# Patient Record
Sex: Female | Born: 1975 | Smoking: Never smoker
Health system: Southern US, Community
[De-identification: ages and names within clinical notes are randomized; demographics above are authoritative.]

## PROBLEM LIST (undated history)

## (undated) DIAGNOSIS — E119 Type 2 diabetes mellitus without complications: Secondary | ICD-10-CM

## (undated) HISTORY — PX: CHOLECYSTECTOMY: SHX55

## (undated) HISTORY — PX: TUBAL LIGATION: SHX77

## (undated) HISTORY — PX: CARPAL TUNNEL RELEASE: SHX101

---

## 2006-06-13 ENCOUNTER — Ambulatory Visit: Payer: Self-pay | Admitting: Obstetrics & Gynecology

## 2012-12-19 ENCOUNTER — Emergency Department (HOSPITAL_COMMUNITY): Payer: Medicare Other

## 2012-12-19 ENCOUNTER — Emergency Department (HOSPITAL_COMMUNITY)
Admission: EM | Admit: 2012-12-19 | Discharge: 2012-12-19 | Disposition: A | Discharge status: Home / Self Care | Payer: Medicare Other | Attending: Emergency Medicine | Admitting: Emergency Medicine

## 2012-12-19 ENCOUNTER — Other Ambulatory Visit: Payer: Self-pay

## 2012-12-19 ENCOUNTER — Encounter (HOSPITAL_COMMUNITY): Payer: Self-pay | Admitting: Emergency Medicine

## 2012-12-19 DIAGNOSIS — F552 Abuse of laxatives: Secondary | ICD-10-CM

## 2012-12-19 DIAGNOSIS — R404 Transient alteration of awareness: Secondary | ICD-10-CM | POA: Diagnosis not present

## 2012-12-19 DIAGNOSIS — X838XXA Intentional self-harm by other specified means, initial encounter: Secondary | ICD-10-CM

## 2012-12-19 DIAGNOSIS — E860 Dehydration: Secondary | ICD-10-CM | POA: Insufficient documentation

## 2012-12-19 DIAGNOSIS — R197 Diarrhea, unspecified: Secondary | ICD-10-CM | POA: Diagnosis not present

## 2012-12-19 DIAGNOSIS — R42 Dizziness and giddiness: Secondary | ICD-10-CM | POA: Diagnosis not present

## 2012-12-19 DIAGNOSIS — E119 Type 2 diabetes mellitus without complications: Secondary | ICD-10-CM

## 2012-12-19 DIAGNOSIS — F1910 Other psychoactive substance abuse, uncomplicated: Secondary | ICD-10-CM | POA: Insufficient documentation

## 2012-12-19 DIAGNOSIS — R55 Syncope and collapse: Secondary | ICD-10-CM

## 2012-12-19 DIAGNOSIS — Z7982 Long term (current) use of aspirin: Secondary | ICD-10-CM | POA: Insufficient documentation

## 2012-12-19 DIAGNOSIS — F6089 Other specific personality disorders: Secondary | ICD-10-CM | POA: Insufficient documentation

## 2012-12-19 DIAGNOSIS — Z3202 Encounter for pregnancy test, result negative: Secondary | ICD-10-CM | POA: Insufficient documentation

## 2012-12-19 DIAGNOSIS — Z79899 Other long term (current) drug therapy: Secondary | ICD-10-CM | POA: Insufficient documentation

## 2012-12-19 HISTORY — DX: Type 2 diabetes mellitus without complications: E11.9

## 2012-12-19 LAB — URINALYSIS, ROUTINE W REFLEX MICROSCOPIC
Glucose, UA: >1000 mg/dL — AB
Hgb urine dipstick: NEGATIVE
Specific Gravity, Urine: 1.037 — ABNORMAL HIGH (ref 1.005–1.030)
Urobilinogen, UA: 0.2 mg/dL (ref 0.0–1.0)
pH: 5.5 (ref 5.0–8.0)

## 2012-12-19 LAB — CBC
HCT: 35.7 % — ABNORMAL LOW (ref 36.0–46.0)
Hemoglobin: 12 g/dL (ref 12.0–15.0)
MCH: 27.6 pg (ref 26.0–34.0)
MCV: 82.3 fL (ref 78.0–100.0)
RBC: 4.34 MIL/uL (ref 3.87–5.11)

## 2012-12-19 LAB — BASIC METABOLIC PANEL
CO2: 22 mEq/L (ref 19–32)
Calcium: 9 mg/dL (ref 8.4–10.5)
Creatinine, Ser: 0.59 mg/dL (ref 0.50–1.10)
Glucose, Bld: 245 mg/dL — ABNORMAL HIGH (ref 70–99)

## 2012-12-19 LAB — GLUCOSE, CAPILLARY

## 2012-12-19 MED ORDER — IBUPROFEN 800 MG PO TABS
800.0000 mg | ORAL_TABLET | Freq: Once | ORAL | Status: AC
  Administered 2012-12-19: 800 mg via ORAL
  Filled 2012-12-19: qty 1

## 2012-12-19 MED ORDER — SODIUM CHLORIDE 0.9 % IV BOLUS (SEPSIS)
1000.0000 mL | Freq: Once | INTRAVENOUS | Status: AC
  Administered 2012-12-19: 1000 mL via INTRAVENOUS

## 2012-12-19 NOTE — ED Notes (Signed)
EAV:[id number]> Expected date:12/19/12<BR> Expected time: 1:18 PM<BR> Means of arrival:<BR> Comments:<BR> Near syncopal episode from [facility]

## 2012-12-19 NOTE — ED Provider Notes (Signed)
History     CSN: [id number]  Arrival date & time 12/19/12  1339   First MD Initiated Contact with Patient 12/19/12 1401      Chief Complaint  Patient presents with  . near syncopal episode     (Consider location/radiation/quality/duration/timing/severity/associated sxs/prior treatment) HPI  Erika Duarte is a 37 y.o.female presenting to the ER by EMS with complaints of near-syncopal episode. The patient is a diabetic who tells me that she has been taking laxatives in order to his weight as well as limiting her calorie intake to 200 calories a day. She has also continued to take 75 units of long acting insulin before bedtime. While at her daughter's orientation for college she became very dizzy and almost fainted. Upon arrival to the emergency department the nurses noted that she had self-inflicted cut marks on her abdomen that they "hate me". I asked the patient about it she said she did it about a week and a half ago that she did that because she's not like herself. Her glucose is 248 BP 114/88 and hr 120. Pt appears lethargic   Past Medical History  Diagnosis Date  . Diabetes mellitus without complication     Past Surgical History  Procedure Laterality Date  . Cholecystectomy    . Carpal tunnel release    . Tubal ligation      No family history on file.  History  Substance Use Topics  . Smoking status: Never Smoker   . Smokeless tobacco: Not on file  . Alcohol Use: No    OB History   Grav Para Term Preterm Abortions TAB SAB Ect Mult Living                  Review of Systems  All other systems reviewed and are negative.    Allergies  Review of patient's allergies indicates no known allergies.  Home Medications   Current Outpatient Rx  Name  Route  Sig  Dispense  Refill  . aspirin 81 MG tablet   Oral   Take 81 mg by mouth daily.         . celecoxib (CELEBREX) 100 MG capsule   Oral   Take 100 mg by mouth 2 (two) times daily.         . DULoxetine  (CYMBALTA) 60 MG capsule   Oral   Take 60 mg by mouth 2 (two) times daily.         . furosemide (LASIX) 20 MG tablet   Oral   Take 20 mg by mouth daily.         . Gabapentin, PHN, (GRALISE) 600 MG TABS   Oral   Take 3,000 mg by mouth at bedtime.         . insulin glargine (LANTUS) 100 UNIT/ML injection   Subcutaneous   Inject 70 Units into the skin at bedtime.         Marland Kitchen levothyroxine (LEVOTHROID) 200 MCG tablet   Oral   Take 200 mcg by mouth daily before breakfast. Pt takes 250 mcg daily. Pt takes with 50 mcg         . levothyroxine (SYNTHROID, LEVOTHROID) 50 MCG tablet   Oral   Take 50 mcg by mouth daily before breakfast. Pt takes with 200 mcg for a total dose of 250 mcg         . Multiple Vitamins-Minerals (MULTIVITAMIN WITH MINERALS) tablet   Oral   Take 1 tablet by mouth daily.         Marland Kitchen  nabumetone (RELAFEN) 500 MG tablet   Oral   Take 500 mg by mouth 2 (two) times daily.         . nortriptyline (PAMELOR) 25 MG capsule   Oral   Take 25 mg by mouth See admin instructions. Pt takes with 50 mg for a total dose of 75 mg         . nortriptyline (PAMELOR) 50 MG capsule   Oral   Take 50 mg by mouth See admin instructions. Pt takes with 25 mg for a total dose of 75 mg         . sennosides-docusate sodium (SENOKOT-S) 8.6-50 MG tablet   Oral   Take 2 tablets by mouth 3 (three) times daily.         . Tapentadol HCl (NUCYNTA) 100 MG TABS   Oral   Take 100 mg by mouth 2 (two) times daily.           BP 134/80  Pulse 87  Temp(Src) 98.1 F (36.7 C) (Oral)  Resp 20  SpO2 96%  LMP 12/05/2012  Physical Exam  Nursing note and vitals reviewed. Constitutional: She appears well-developed and well-nourished. She appears lethargic.  obese  HENT:  Head: Normocephalic and atraumatic.  Eyes: Pupils are equal, round, and reactive to light.  Neck: Normal range of motion. Neck supple.  Cardiovascular: Normal rate and regular rhythm.   Pulmonary/Chest:  Effort normal.  Abdominal: Soft.  Neurological: She has normal strength. She appears lethargic. She displays a negative Romberg sign.  Pt uncooperative with exam.  Skin: Skin is warm and dry.    ED Course  Procedures (including critical care time)  Labs Reviewed  GLUCOSE, CAPILLARY - Abnormal; Notable for the following:    Glucose-Capillary 240 (*)    All other components within normal limits  URINALYSIS, ROUTINE W REFLEX MICROSCOPIC - Abnormal; Notable for the following:    APPearance CLOUDY (*)    Specific Gravity, Urine 1.037 (*)    Glucose, UA >1000 (*)    All other components within normal limits  CBC - Abnormal; Notable for the following:    WBC 10.6 (*)    HCT 35.7 (*)    All other components within normal limits  BASIC METABOLIC PANEL - Abnormal; Notable for the following:    Glucose, Bld 245 (*)    All other components within normal limits  URINE MICROSCOPIC-ADD ON - Abnormal; Notable for the following:    Squamous Epithelial / LPF MANY (*)    Bacteria, UA FEW (*)    All other components within normal limits  PREGNANCY, URINE   Ct Head Wo Contrast  12/19/2012   *RADIOLOGY REPORT*  Clinical Data: 37 year old female with dizziness near syncope.  CT HEAD WITHOUT CONTRAST  Technique:  Contiguous axial images were obtained from the base of the skull through the vertex without contrast.  Comparison: [facility] CT 03/05/2006.  Findings: Visualized paranasal sinuses and mastoids are clear. Visualized orbits and scalp soft tissues are within normal limits. No acute osseous abnormality identified.  Normal cerebral volume.  No midline shift, ventriculomegaly, mass effect, evidence of mass lesion, intracranial hemorrhage or evidence of cortically based acute infarction.  Gray-white matter differentiation is within normal limits throughout the brain.  No suspicious intracranial vascular hyperdensity.  IMPRESSION: Stable and normal noncontrast CT appearance of the brain.    Original Report Authenticated By: Erskine Speed, M.D.     1. Self-harm, initial encounter   2. Dehydration  MDM   Patient most likely dehydrated but will get Head CT since she continues to act lethargic.  CT scan head is normal as well as labs over all are unremarkable. Family members are in the room with her and they endorse knowing the patient had change dher eating and that she was using laxatives. They also knew about her cutting.  Dr. Adriana Simas saw patient as well adn she is medically cleared at this time. I have asked ACT to come and speak with patient to offer any assistance she can to help the patient with her self hatred and body image problems. She has been made aware that she needs at least 1200 calories per day and can not abuse laxatives because they dehydrate her as opposed to assist weight loss.  7:26pm-  ACT gave the patient resources and talked to her about if she wants helped. Discussed with Dr. Adriana Simas and patient is medically clear to discharge at this time. Patient encouraged to eat.  36 y.o.Erika Duarte's evaluation in the Emergency Department is complete. It has been determined that no acute conditions requiring further emergency intervention are present at this time. The patient/guardian have been advised of the diagnosis and plan. We have discussed signs and symptoms that warrant return to the ED, such as changes or worsening in symptoms.  Vital signs are stable at discharge. Filed Vitals:   12/19/12 1827  BP: 134/80  Pulse: 87  Temp: 98.1 F (36.7 C)  Resp: 20    Patient/guardian has voiced understanding and agreed to follow-up with the PCP or specialist.   Dorthula Matas, PA-C 12/19/12 1927

## 2012-12-19 NOTE — ED Notes (Signed)
Per EMS, had near episode while at daughter's orientation, states she has been taking extreme amounts of laxatives and limiting calories to loose weight-also cutting self, wrote "hate me" cut across her stomach-CBG 248, BP 114/88 HR 120

## 2012-12-19 NOTE — Progress Notes (Signed)
   CARE MANAGEMENT ED NOTE 12/19/2012  Patient:  Erika Duarte, Erika Duarte.   Account Number:  [id number]  Date Initiated:  12/19/2012  Documentation initiated by:  Radford Pax  Subjective/Objective Assessment:   Patient prsented to ED with syncopal episode.     Subjective/Objective Assessment Detail:     Action/Plan:   Action/Plan Detail:   Anticipated DC Date:       Status Recommendation to Physician:   Result of Recommendation:    Other ED Services  Consult Working Plan    DC Planning Services  Other  PCP issues    Choice offered to / List presented to:            Status of service:  Completed, signed off  ED Comments:   ED Comments Detail:  Patient listed as not having PCP.  EDCM spoke to patient whho stated Dr. Nedra Hai at [facility] was her PCP.  No further needs at this time.

## 2012-12-21 NOTE — ED Provider Notes (Signed)
Medical screening examination/treatment/procedure(s) were conducted as a shared visit with non-physician practitioner(s) and myself.  I personally evaluated the patient during the encounter.  Patient is hemodynamically stable.  Will get behavioral health consultation  Donnetta Hutching, MD 12/21/12 0730

## 2013-01-22 DIAGNOSIS — E7800 Pure hypercholesterolemia, unspecified: Secondary | ICD-10-CM | POA: Diagnosis not present

## 2013-01-22 DIAGNOSIS — G894 Chronic pain syndrome: Secondary | ICD-10-CM | POA: Diagnosis not present

## 2013-01-22 DIAGNOSIS — Z5181 Encounter for therapeutic drug level monitoring: Secondary | ICD-10-CM | POA: Diagnosis not present

## 2013-01-22 DIAGNOSIS — E1142 Type 2 diabetes mellitus with diabetic polyneuropathy: Secondary | ICD-10-CM | POA: Diagnosis not present

## 2013-01-22 DIAGNOSIS — M5137 Other intervertebral disc degeneration, lumbosacral region: Secondary | ICD-10-CM | POA: Diagnosis not present

## 2013-01-22 DIAGNOSIS — Z79899 Other long term (current) drug therapy: Secondary | ICD-10-CM | POA: Diagnosis not present

## 2013-01-22 DIAGNOSIS — E119 Type 2 diabetes mellitus without complications: Secondary | ICD-10-CM | POA: Diagnosis not present

## 2013-01-22 DIAGNOSIS — E1349 Other specified diabetes mellitus with other diabetic neurological complication: Secondary | ICD-10-CM | POA: Diagnosis not present

## 2013-01-22 DIAGNOSIS — F329 Major depressive disorder, single episode, unspecified: Secondary | ICD-10-CM | POA: Diagnosis not present

## 2013-01-22 DIAGNOSIS — E039 Hypothyroidism, unspecified: Secondary | ICD-10-CM | POA: Diagnosis not present

## 2013-01-30 DIAGNOSIS — E039 Hypothyroidism, unspecified: Secondary | ICD-10-CM | POA: Diagnosis not present

## 2013-01-30 DIAGNOSIS — Z6841 Body Mass Index (BMI) 40.0 and over, adult: Secondary | ICD-10-CM | POA: Diagnosis not present

## 2013-01-30 DIAGNOSIS — E119 Type 2 diabetes mellitus without complications: Secondary | ICD-10-CM | POA: Diagnosis not present

## 2013-01-30 DIAGNOSIS — J309 Allergic rhinitis, unspecified: Secondary | ICD-10-CM | POA: Diagnosis not present

## 2013-01-30 DIAGNOSIS — E7800 Pure hypercholesterolemia, unspecified: Secondary | ICD-10-CM | POA: Diagnosis not present

## 2013-02-13 DIAGNOSIS — M5137 Other intervertebral disc degeneration, lumbosacral region: Secondary | ICD-10-CM | POA: Diagnosis not present

## 2013-02-13 DIAGNOSIS — G894 Chronic pain syndrome: Secondary | ICD-10-CM | POA: Diagnosis not present

## 2013-04-11 DIAGNOSIS — E1142 Type 2 diabetes mellitus with diabetic polyneuropathy: Secondary | ICD-10-CM | POA: Diagnosis not present

## 2013-04-11 DIAGNOSIS — E1349 Other specified diabetes mellitus with other diabetic neurological complication: Secondary | ICD-10-CM | POA: Diagnosis not present

## 2013-04-11 DIAGNOSIS — G894 Chronic pain syndrome: Secondary | ICD-10-CM | POA: Diagnosis not present

## 2013-04-11 DIAGNOSIS — M5137 Other intervertebral disc degeneration, lumbosacral region: Secondary | ICD-10-CM | POA: Diagnosis not present

## 2013-04-11 DIAGNOSIS — Z79899 Other long term (current) drug therapy: Secondary | ICD-10-CM | POA: Diagnosis not present

## 2013-06-10 DIAGNOSIS — M5137 Other intervertebral disc degeneration, lumbosacral region: Secondary | ICD-10-CM | POA: Diagnosis not present

## 2013-06-10 DIAGNOSIS — G894 Chronic pain syndrome: Secondary | ICD-10-CM | POA: Diagnosis not present

## 2013-06-10 DIAGNOSIS — E1142 Type 2 diabetes mellitus with diabetic polyneuropathy: Secondary | ICD-10-CM | POA: Diagnosis not present

## 2013-06-10 DIAGNOSIS — E1349 Other specified diabetes mellitus with other diabetic neurological complication: Secondary | ICD-10-CM | POA: Diagnosis not present

## 2013-07-08 DIAGNOSIS — E039 Hypothyroidism, unspecified: Secondary | ICD-10-CM | POA: Diagnosis not present

## 2013-07-08 DIAGNOSIS — N912 Amenorrhea, unspecified: Secondary | ICD-10-CM | POA: Diagnosis not present

## 2013-07-08 DIAGNOSIS — Z79899 Other long term (current) drug therapy: Secondary | ICD-10-CM | POA: Diagnosis not present

## 2013-07-15 DIAGNOSIS — R945 Abnormal results of liver function studies: Secondary | ICD-10-CM | POA: Diagnosis not present

## 2013-07-15 DIAGNOSIS — F329 Major depressive disorder, single episode, unspecified: Secondary | ICD-10-CM | POA: Diagnosis not present

## 2013-07-15 DIAGNOSIS — E039 Hypothyroidism, unspecified: Secondary | ICD-10-CM | POA: Diagnosis not present

## 2013-07-15 DIAGNOSIS — E119 Type 2 diabetes mellitus without complications: Secondary | ICD-10-CM | POA: Diagnosis not present

## 2013-07-30 DIAGNOSIS — E039 Hypothyroidism, unspecified: Secondary | ICD-10-CM | POA: Diagnosis not present

## 2013-07-30 DIAGNOSIS — M5137 Other intervertebral disc degeneration, lumbosacral region: Secondary | ICD-10-CM | POA: Diagnosis not present

## 2013-07-30 DIAGNOSIS — J309 Allergic rhinitis, unspecified: Secondary | ICD-10-CM | POA: Diagnosis not present

## 2013-07-30 DIAGNOSIS — E119 Type 2 diabetes mellitus without complications: Secondary | ICD-10-CM | POA: Diagnosis not present

## 2013-07-30 DIAGNOSIS — Z79899 Other long term (current) drug therapy: Secondary | ICD-10-CM | POA: Diagnosis not present

## 2013-07-30 DIAGNOSIS — E7800 Pure hypercholesterolemia, unspecified: Secondary | ICD-10-CM | POA: Diagnosis not present

## 2013-07-30 DIAGNOSIS — G894 Chronic pain syndrome: Secondary | ICD-10-CM | POA: Diagnosis not present

## 2013-07-30 DIAGNOSIS — E1349 Other specified diabetes mellitus with other diabetic neurological complication: Secondary | ICD-10-CM | POA: Diagnosis not present

## 2013-08-07 DIAGNOSIS — M545 Low back pain, unspecified: Secondary | ICD-10-CM | POA: Diagnosis not present

## 2013-09-18 DIAGNOSIS — M5137 Other intervertebral disc degeneration, lumbosacral region: Secondary | ICD-10-CM | POA: Diagnosis not present

## 2013-09-18 DIAGNOSIS — M545 Low back pain, unspecified: Secondary | ICD-10-CM | POA: Diagnosis not present

## 2013-09-18 DIAGNOSIS — E1349 Other specified diabetes mellitus with other diabetic neurological complication: Secondary | ICD-10-CM | POA: Diagnosis not present

## 2013-09-18 DIAGNOSIS — G894 Chronic pain syndrome: Secondary | ICD-10-CM | POA: Diagnosis not present

## 2013-10-16 DIAGNOSIS — E11319 Type 2 diabetes mellitus with unspecified diabetic retinopathy without macular edema: Secondary | ICD-10-CM | POA: Diagnosis not present

## 2013-10-24 DIAGNOSIS — E039 Hypothyroidism, unspecified: Secondary | ICD-10-CM | POA: Diagnosis not present

## 2013-10-24 DIAGNOSIS — IMO0001 Reserved for inherently not codable concepts without codable children: Secondary | ICD-10-CM | POA: Diagnosis not present

## 2013-10-24 DIAGNOSIS — E7800 Pure hypercholesterolemia, unspecified: Secondary | ICD-10-CM | POA: Diagnosis not present

## 2013-10-24 DIAGNOSIS — E119 Type 2 diabetes mellitus without complications: Secondary | ICD-10-CM | POA: Diagnosis not present

## 2013-10-28 DIAGNOSIS — E1142 Type 2 diabetes mellitus with diabetic polyneuropathy: Secondary | ICD-10-CM | POA: Diagnosis not present

## 2013-10-28 DIAGNOSIS — M545 Low back pain, unspecified: Secondary | ICD-10-CM | POA: Diagnosis not present

## 2013-10-28 DIAGNOSIS — M5137 Other intervertebral disc degeneration, lumbosacral region: Secondary | ICD-10-CM | POA: Diagnosis not present

## 2013-10-28 DIAGNOSIS — E1349 Other specified diabetes mellitus with other diabetic neurological complication: Secondary | ICD-10-CM | POA: Diagnosis not present

## 2013-10-28 DIAGNOSIS — G894 Chronic pain syndrome: Secondary | ICD-10-CM | POA: Diagnosis not present

## 2014-01-01 DIAGNOSIS — G894 Chronic pain syndrome: Secondary | ICD-10-CM | POA: Diagnosis not present

## 2014-01-27 DIAGNOSIS — F411 Generalized anxiety disorder: Secondary | ICD-10-CM | POA: Diagnosis not present

## 2014-03-03 DIAGNOSIS — Z5181 Encounter for therapeutic drug level monitoring: Secondary | ICD-10-CM | POA: Diagnosis not present

## 2014-03-03 DIAGNOSIS — M545 Low back pain, unspecified: Secondary | ICD-10-CM | POA: Diagnosis not present

## 2014-03-03 DIAGNOSIS — M5137 Other intervertebral disc degeneration, lumbosacral region: Secondary | ICD-10-CM | POA: Diagnosis not present

## 2014-03-03 DIAGNOSIS — G894 Chronic pain syndrome: Secondary | ICD-10-CM | POA: Diagnosis not present

## 2014-03-03 DIAGNOSIS — E1142 Type 2 diabetes mellitus with diabetic polyneuropathy: Secondary | ICD-10-CM | POA: Diagnosis not present

## 2014-03-03 DIAGNOSIS — E1349 Other specified diabetes mellitus with other diabetic neurological complication: Secondary | ICD-10-CM | POA: Diagnosis not present

## 2014-03-03 DIAGNOSIS — Z79899 Other long term (current) drug therapy: Secondary | ICD-10-CM | POA: Diagnosis not present

## 2014-03-04 DIAGNOSIS — F329 Major depressive disorder, single episode, unspecified: Secondary | ICD-10-CM | POA: Diagnosis not present

## 2014-03-04 DIAGNOSIS — F3289 Other specified depressive episodes: Secondary | ICD-10-CM | POA: Diagnosis not present

## 2014-03-04 DIAGNOSIS — E119 Type 2 diabetes mellitus without complications: Secondary | ICD-10-CM | POA: Diagnosis not present

## 2014-03-04 DIAGNOSIS — R945 Abnormal results of liver function studies: Secondary | ICD-10-CM | POA: Diagnosis not present

## 2014-03-04 DIAGNOSIS — E039 Hypothyroidism, unspecified: Secondary | ICD-10-CM | POA: Diagnosis not present

## 2014-03-04 DIAGNOSIS — E7800 Pure hypercholesterolemia, unspecified: Secondary | ICD-10-CM | POA: Diagnosis not present

## 2014-03-18 DIAGNOSIS — E119 Type 2 diabetes mellitus without complications: Secondary | ICD-10-CM | POA: Diagnosis not present

## 2014-03-18 DIAGNOSIS — E039 Hypothyroidism, unspecified: Secondary | ICD-10-CM | POA: Diagnosis not present

## 2014-03-18 DIAGNOSIS — IMO0001 Reserved for inherently not codable concepts without codable children: Secondary | ICD-10-CM | POA: Diagnosis not present

## 2014-03-18 DIAGNOSIS — E7800 Pure hypercholesterolemia, unspecified: Secondary | ICD-10-CM | POA: Diagnosis not present

## 2014-03-24 DIAGNOSIS — M5137 Other intervertebral disc degeneration, lumbosacral region: Secondary | ICD-10-CM | POA: Diagnosis not present

## 2014-03-24 DIAGNOSIS — E1349 Other specified diabetes mellitus with other diabetic neurological complication: Secondary | ICD-10-CM | POA: Diagnosis not present

## 2014-03-24 DIAGNOSIS — M545 Low back pain, unspecified: Secondary | ICD-10-CM | POA: Diagnosis not present

## 2014-03-24 DIAGNOSIS — G894 Chronic pain syndrome: Secondary | ICD-10-CM | POA: Diagnosis not present

## 2014-04-06 DIAGNOSIS — G894 Chronic pain syndrome: Secondary | ICD-10-CM | POA: Diagnosis not present

## 2014-04-06 DIAGNOSIS — G8929 Other chronic pain: Secondary | ICD-10-CM | POA: Diagnosis not present

## 2014-04-06 DIAGNOSIS — M5137 Other intervertebral disc degeneration, lumbosacral region: Secondary | ICD-10-CM | POA: Diagnosis not present

## 2014-04-06 DIAGNOSIS — M549 Dorsalgia, unspecified: Secondary | ICD-10-CM | POA: Diagnosis not present

## 2014-04-13 DIAGNOSIS — Z23 Encounter for immunization: Secondary | ICD-10-CM | POA: Diagnosis not present

## 2014-04-13 DIAGNOSIS — E039 Hypothyroidism, unspecified: Secondary | ICD-10-CM | POA: Diagnosis not present

## 2014-04-13 DIAGNOSIS — E1149 Type 2 diabetes mellitus with other diabetic neurological complication: Secondary | ICD-10-CM | POA: Diagnosis not present

## 2014-04-13 DIAGNOSIS — IMO0001 Reserved for inherently not codable concepts without codable children: Secondary | ICD-10-CM | POA: Diagnosis not present

## 2014-05-06 DIAGNOSIS — Z794 Long term (current) use of insulin: Secondary | ICD-10-CM | POA: Diagnosis not present

## 2014-05-06 DIAGNOSIS — F17210 Nicotine dependence, cigarettes, uncomplicated: Secondary | ICD-10-CM | POA: Diagnosis not present

## 2014-05-06 DIAGNOSIS — M5416 Radiculopathy, lumbar region: Secondary | ICD-10-CM | POA: Diagnosis not present

## 2014-05-06 DIAGNOSIS — M1990 Unspecified osteoarthritis, unspecified site: Secondary | ICD-10-CM | POA: Diagnosis not present

## 2014-05-06 DIAGNOSIS — G894 Chronic pain syndrome: Secondary | ICD-10-CM | POA: Diagnosis not present

## 2014-05-06 DIAGNOSIS — E119 Type 2 diabetes mellitus without complications: Secondary | ICD-10-CM | POA: Diagnosis not present

## 2014-05-06 DIAGNOSIS — I10 Essential (primary) hypertension: Secondary | ICD-10-CM | POA: Diagnosis not present

## 2014-05-06 DIAGNOSIS — Z6841 Body Mass Index (BMI) 40.0 and over, adult: Secondary | ICD-10-CM | POA: Diagnosis not present

## 2014-05-19 DIAGNOSIS — G609 Hereditary and idiopathic neuropathy, unspecified: Secondary | ICD-10-CM | POA: Diagnosis not present

## 2014-05-19 DIAGNOSIS — M549 Dorsalgia, unspecified: Secondary | ICD-10-CM | POA: Diagnosis not present

## 2014-05-19 DIAGNOSIS — G894 Chronic pain syndrome: Secondary | ICD-10-CM | POA: Diagnosis not present

## 2014-05-19 DIAGNOSIS — Z79891 Long term (current) use of opiate analgesic: Secondary | ICD-10-CM | POA: Diagnosis not present

## 2014-06-18 DIAGNOSIS — G609 Hereditary and idiopathic neuropathy, unspecified: Secondary | ICD-10-CM | POA: Diagnosis not present

## 2014-06-18 DIAGNOSIS — Z9689 Presence of other specified functional implants: Secondary | ICD-10-CM | POA: Diagnosis not present

## 2014-06-18 DIAGNOSIS — E0840 Diabetes mellitus due to underlying condition with diabetic neuropathy, unspecified: Secondary | ICD-10-CM | POA: Diagnosis not present

## 2014-06-18 DIAGNOSIS — M549 Dorsalgia, unspecified: Secondary | ICD-10-CM | POA: Diagnosis not present

## 2014-06-18 DIAGNOSIS — G894 Chronic pain syndrome: Secondary | ICD-10-CM | POA: Diagnosis not present

## 2014-07-30 DIAGNOSIS — R945 Abnormal results of liver function studies: Secondary | ICD-10-CM | POA: Diagnosis not present

## 2014-07-30 DIAGNOSIS — Z6841 Body Mass Index (BMI) 40.0 and over, adult: Secondary | ICD-10-CM | POA: Diagnosis not present

## 2014-07-30 DIAGNOSIS — F329 Major depressive disorder, single episode, unspecified: Secondary | ICD-10-CM | POA: Diagnosis not present

## 2014-07-30 DIAGNOSIS — E1142 Type 2 diabetes mellitus with diabetic polyneuropathy: Secondary | ICD-10-CM | POA: Diagnosis not present

## 2014-07-30 DIAGNOSIS — E1165 Type 2 diabetes mellitus with hyperglycemia: Secondary | ICD-10-CM | POA: Diagnosis not present

## 2014-07-30 DIAGNOSIS — J309 Allergic rhinitis, unspecified: Secondary | ICD-10-CM | POA: Diagnosis not present

## 2014-07-30 DIAGNOSIS — M159 Polyosteoarthritis, unspecified: Secondary | ICD-10-CM | POA: Diagnosis not present

## 2014-07-30 DIAGNOSIS — E039 Hypothyroidism, unspecified: Secondary | ICD-10-CM | POA: Diagnosis not present

## 2014-07-30 DIAGNOSIS — M797 Fibromyalgia: Secondary | ICD-10-CM | POA: Diagnosis not present

## 2014-07-30 DIAGNOSIS — E780 Pure hypercholesterolemia: Secondary | ICD-10-CM | POA: Diagnosis not present

## 2014-07-30 DIAGNOSIS — R600 Localized edema: Secondary | ICD-10-CM | POA: Diagnosis not present

## 2014-08-05 DIAGNOSIS — J309 Allergic rhinitis, unspecified: Secondary | ICD-10-CM | POA: Diagnosis not present

## 2014-08-05 DIAGNOSIS — M159 Polyosteoarthritis, unspecified: Secondary | ICD-10-CM | POA: Diagnosis not present

## 2014-08-05 DIAGNOSIS — E039 Hypothyroidism, unspecified: Secondary | ICD-10-CM | POA: Diagnosis not present

## 2014-08-05 DIAGNOSIS — E780 Pure hypercholesterolemia: Secondary | ICD-10-CM | POA: Diagnosis not present

## 2014-08-05 DIAGNOSIS — F329 Major depressive disorder, single episode, unspecified: Secondary | ICD-10-CM | POA: Diagnosis not present

## 2014-08-05 DIAGNOSIS — Z6841 Body Mass Index (BMI) 40.0 and over, adult: Secondary | ICD-10-CM | POA: Diagnosis not present

## 2014-08-05 DIAGNOSIS — M797 Fibromyalgia: Secondary | ICD-10-CM | POA: Diagnosis not present

## 2014-08-05 DIAGNOSIS — E1142 Type 2 diabetes mellitus with diabetic polyneuropathy: Secondary | ICD-10-CM | POA: Diagnosis not present

## 2014-08-05 DIAGNOSIS — E1165 Type 2 diabetes mellitus with hyperglycemia: Secondary | ICD-10-CM | POA: Diagnosis not present

## 2014-08-05 DIAGNOSIS — R945 Abnormal results of liver function studies: Secondary | ICD-10-CM | POA: Diagnosis not present

## 2014-08-05 DIAGNOSIS — R600 Localized edema: Secondary | ICD-10-CM | POA: Diagnosis not present

## 2014-08-12 DIAGNOSIS — E780 Pure hypercholesterolemia: Secondary | ICD-10-CM | POA: Diagnosis not present

## 2014-08-12 DIAGNOSIS — J309 Allergic rhinitis, unspecified: Secondary | ICD-10-CM | POA: Diagnosis not present

## 2014-08-12 DIAGNOSIS — E039 Hypothyroidism, unspecified: Secondary | ICD-10-CM | POA: Diagnosis not present

## 2014-08-12 DIAGNOSIS — R600 Localized edema: Secondary | ICD-10-CM | POA: Diagnosis not present

## 2014-08-12 DIAGNOSIS — E1165 Type 2 diabetes mellitus with hyperglycemia: Secondary | ICD-10-CM | POA: Diagnosis not present

## 2014-08-12 DIAGNOSIS — E1142 Type 2 diabetes mellitus with diabetic polyneuropathy: Secondary | ICD-10-CM | POA: Diagnosis not present

## 2014-08-12 DIAGNOSIS — R945 Abnormal results of liver function studies: Secondary | ICD-10-CM | POA: Diagnosis not present

## 2014-08-12 DIAGNOSIS — M159 Polyosteoarthritis, unspecified: Secondary | ICD-10-CM | POA: Diagnosis not present

## 2014-08-12 DIAGNOSIS — F329 Major depressive disorder, single episode, unspecified: Secondary | ICD-10-CM | POA: Diagnosis not present

## 2014-08-12 DIAGNOSIS — M797 Fibromyalgia: Secondary | ICD-10-CM | POA: Diagnosis not present

## 2014-08-12 DIAGNOSIS — Z6841 Body Mass Index (BMI) 40.0 and over, adult: Secondary | ICD-10-CM | POA: Diagnosis not present

## 2014-08-20 DIAGNOSIS — M549 Dorsalgia, unspecified: Secondary | ICD-10-CM | POA: Diagnosis not present

## 2014-08-20 DIAGNOSIS — G609 Hereditary and idiopathic neuropathy, unspecified: Secondary | ICD-10-CM | POA: Diagnosis not present

## 2014-08-20 DIAGNOSIS — G894 Chronic pain syndrome: Secondary | ICD-10-CM | POA: Diagnosis not present

## 2014-08-20 DIAGNOSIS — Z5181 Encounter for therapeutic drug level monitoring: Secondary | ICD-10-CM | POA: Diagnosis not present

## 2014-08-20 DIAGNOSIS — Z9689 Presence of other specified functional implants: Secondary | ICD-10-CM | POA: Diagnosis not present

## 2014-08-20 DIAGNOSIS — Z79899 Other long term (current) drug therapy: Secondary | ICD-10-CM | POA: Diagnosis not present

## 2014-09-03 DIAGNOSIS — F329 Major depressive disorder, single episode, unspecified: Secondary | ICD-10-CM | POA: Diagnosis not present

## 2014-09-03 DIAGNOSIS — E039 Hypothyroidism, unspecified: Secondary | ICD-10-CM | POA: Diagnosis not present

## 2014-09-03 DIAGNOSIS — R600 Localized edema: Secondary | ICD-10-CM | POA: Diagnosis not present

## 2014-09-03 DIAGNOSIS — E1165 Type 2 diabetes mellitus with hyperglycemia: Secondary | ICD-10-CM | POA: Diagnosis not present

## 2014-09-03 DIAGNOSIS — M159 Polyosteoarthritis, unspecified: Secondary | ICD-10-CM | POA: Diagnosis not present

## 2014-09-03 DIAGNOSIS — M797 Fibromyalgia: Secondary | ICD-10-CM | POA: Diagnosis not present

## 2014-09-03 DIAGNOSIS — M65841 Other synovitis and tenosynovitis, right hand: Secondary | ICD-10-CM | POA: Diagnosis not present

## 2014-09-03 DIAGNOSIS — R945 Abnormal results of liver function studies: Secondary | ICD-10-CM | POA: Diagnosis not present

## 2014-09-03 DIAGNOSIS — J309 Allergic rhinitis, unspecified: Secondary | ICD-10-CM | POA: Diagnosis not present

## 2014-09-03 DIAGNOSIS — E780 Pure hypercholesterolemia: Secondary | ICD-10-CM | POA: Diagnosis not present

## 2014-09-03 DIAGNOSIS — E1142 Type 2 diabetes mellitus with diabetic polyneuropathy: Secondary | ICD-10-CM | POA: Diagnosis not present

## 2014-09-11 DIAGNOSIS — R945 Abnormal results of liver function studies: Secondary | ICD-10-CM | POA: Diagnosis not present

## 2014-09-11 DIAGNOSIS — M159 Polyosteoarthritis, unspecified: Secondary | ICD-10-CM | POA: Diagnosis not present

## 2014-09-11 DIAGNOSIS — E1165 Type 2 diabetes mellitus with hyperglycemia: Secondary | ICD-10-CM | POA: Diagnosis not present

## 2014-09-11 DIAGNOSIS — Z6841 Body Mass Index (BMI) 40.0 and over, adult: Secondary | ICD-10-CM | POA: Diagnosis not present

## 2014-09-11 DIAGNOSIS — R600 Localized edema: Secondary | ICD-10-CM | POA: Diagnosis not present

## 2014-09-11 DIAGNOSIS — E1142 Type 2 diabetes mellitus with diabetic polyneuropathy: Secondary | ICD-10-CM | POA: Diagnosis not present

## 2014-09-11 DIAGNOSIS — J309 Allergic rhinitis, unspecified: Secondary | ICD-10-CM | POA: Diagnosis not present

## 2014-09-11 DIAGNOSIS — M797 Fibromyalgia: Secondary | ICD-10-CM | POA: Diagnosis not present

## 2014-09-11 DIAGNOSIS — E039 Hypothyroidism, unspecified: Secondary | ICD-10-CM | POA: Diagnosis not present

## 2014-09-11 DIAGNOSIS — F329 Major depressive disorder, single episode, unspecified: Secondary | ICD-10-CM | POA: Diagnosis not present

## 2014-09-11 DIAGNOSIS — E780 Pure hypercholesterolemia: Secondary | ICD-10-CM | POA: Diagnosis not present

## 2014-09-17 DIAGNOSIS — G609 Hereditary and idiopathic neuropathy, unspecified: Secondary | ICD-10-CM | POA: Diagnosis not present

## 2014-09-17 DIAGNOSIS — G894 Chronic pain syndrome: Secondary | ICD-10-CM | POA: Diagnosis not present

## 2014-09-17 DIAGNOSIS — Z9689 Presence of other specified functional implants: Secondary | ICD-10-CM | POA: Diagnosis not present

## 2014-09-17 DIAGNOSIS — M549 Dorsalgia, unspecified: Secondary | ICD-10-CM | POA: Diagnosis not present

## 2014-10-12 DIAGNOSIS — M159 Polyosteoarthritis, unspecified: Secondary | ICD-10-CM | POA: Diagnosis not present

## 2014-10-12 DIAGNOSIS — E780 Pure hypercholesterolemia: Secondary | ICD-10-CM | POA: Diagnosis not present

## 2014-10-12 DIAGNOSIS — E039 Hypothyroidism, unspecified: Secondary | ICD-10-CM | POA: Diagnosis not present

## 2014-10-12 DIAGNOSIS — J309 Allergic rhinitis, unspecified: Secondary | ICD-10-CM | POA: Diagnosis not present

## 2014-10-12 DIAGNOSIS — E1165 Type 2 diabetes mellitus with hyperglycemia: Secondary | ICD-10-CM | POA: Diagnosis not present

## 2014-10-12 DIAGNOSIS — R600 Localized edema: Secondary | ICD-10-CM | POA: Diagnosis not present

## 2014-10-12 DIAGNOSIS — F329 Major depressive disorder, single episode, unspecified: Secondary | ICD-10-CM | POA: Diagnosis not present

## 2014-10-12 DIAGNOSIS — M797 Fibromyalgia: Secondary | ICD-10-CM | POA: Diagnosis not present

## 2014-10-12 DIAGNOSIS — E1142 Type 2 diabetes mellitus with diabetic polyneuropathy: Secondary | ICD-10-CM | POA: Diagnosis not present

## 2014-10-12 DIAGNOSIS — R945 Abnormal results of liver function studies: Secondary | ICD-10-CM | POA: Diagnosis not present

## 2014-10-12 DIAGNOSIS — Z6841 Body Mass Index (BMI) 40.0 and over, adult: Secondary | ICD-10-CM | POA: Diagnosis not present

## 2014-10-16 DIAGNOSIS — G894 Chronic pain syndrome: Secondary | ICD-10-CM | POA: Diagnosis not present

## 2014-10-16 DIAGNOSIS — E0840 Diabetes mellitus due to underlying condition with diabetic neuropathy, unspecified: Secondary | ICD-10-CM | POA: Diagnosis not present

## 2014-10-16 DIAGNOSIS — M5136 Other intervertebral disc degeneration, lumbar region: Secondary | ICD-10-CM | POA: Diagnosis not present

## 2014-10-30 DIAGNOSIS — F329 Major depressive disorder, single episode, unspecified: Secondary | ICD-10-CM | POA: Diagnosis not present

## 2014-10-30 DIAGNOSIS — J309 Allergic rhinitis, unspecified: Secondary | ICD-10-CM | POA: Diagnosis not present

## 2014-10-30 DIAGNOSIS — R945 Abnormal results of liver function studies: Secondary | ICD-10-CM | POA: Diagnosis not present

## 2014-10-30 DIAGNOSIS — E039 Hypothyroidism, unspecified: Secondary | ICD-10-CM | POA: Diagnosis not present

## 2014-10-30 DIAGNOSIS — M797 Fibromyalgia: Secondary | ICD-10-CM | POA: Diagnosis not present

## 2014-10-30 DIAGNOSIS — M159 Polyosteoarthritis, unspecified: Secondary | ICD-10-CM | POA: Diagnosis not present

## 2014-10-30 DIAGNOSIS — E1142 Type 2 diabetes mellitus with diabetic polyneuropathy: Secondary | ICD-10-CM | POA: Diagnosis not present

## 2014-10-30 DIAGNOSIS — E1165 Type 2 diabetes mellitus with hyperglycemia: Secondary | ICD-10-CM | POA: Diagnosis not present

## 2014-10-30 DIAGNOSIS — Z6841 Body Mass Index (BMI) 40.0 and over, adult: Secondary | ICD-10-CM | POA: Diagnosis not present

## 2014-10-30 DIAGNOSIS — E780 Pure hypercholesterolemia: Secondary | ICD-10-CM | POA: Diagnosis not present

## 2014-10-30 DIAGNOSIS — R600 Localized edema: Secondary | ICD-10-CM | POA: Diagnosis not present

## 2014-11-10 DIAGNOSIS — E039 Hypothyroidism, unspecified: Secondary | ICD-10-CM | POA: Diagnosis not present

## 2014-11-10 DIAGNOSIS — E1165 Type 2 diabetes mellitus with hyperglycemia: Secondary | ICD-10-CM | POA: Diagnosis not present

## 2014-11-10 DIAGNOSIS — F329 Major depressive disorder, single episode, unspecified: Secondary | ICD-10-CM | POA: Diagnosis not present

## 2014-11-10 DIAGNOSIS — E780 Pure hypercholesterolemia: Secondary | ICD-10-CM | POA: Diagnosis not present

## 2014-11-10 DIAGNOSIS — M797 Fibromyalgia: Secondary | ICD-10-CM | POA: Diagnosis not present

## 2014-11-10 DIAGNOSIS — R51 Headache: Secondary | ICD-10-CM | POA: Diagnosis not present

## 2014-11-10 DIAGNOSIS — J309 Allergic rhinitis, unspecified: Secondary | ICD-10-CM | POA: Diagnosis not present

## 2014-11-10 DIAGNOSIS — Z1389 Encounter for screening for other disorder: Secondary | ICD-10-CM | POA: Diagnosis not present

## 2014-11-10 DIAGNOSIS — Z9181 History of falling: Secondary | ICD-10-CM | POA: Diagnosis not present

## 2014-11-10 DIAGNOSIS — R945 Abnormal results of liver function studies: Secondary | ICD-10-CM | POA: Diagnosis not present

## 2014-11-10 DIAGNOSIS — R600 Localized edema: Secondary | ICD-10-CM | POA: Diagnosis not present

## 2014-11-10 DIAGNOSIS — M159 Polyosteoarthritis, unspecified: Secondary | ICD-10-CM | POA: Diagnosis not present

## 2014-11-10 DIAGNOSIS — E1142 Type 2 diabetes mellitus with diabetic polyneuropathy: Secondary | ICD-10-CM | POA: Diagnosis not present

## 2014-11-11 DIAGNOSIS — H5213 Myopia, bilateral: Secondary | ICD-10-CM | POA: Diagnosis not present

## 2014-11-11 DIAGNOSIS — E119 Type 2 diabetes mellitus without complications: Secondary | ICD-10-CM | POA: Diagnosis not present

## 2014-11-13 DIAGNOSIS — E0840 Diabetes mellitus due to underlying condition with diabetic neuropathy, unspecified: Secondary | ICD-10-CM | POA: Diagnosis not present

## 2014-11-13 DIAGNOSIS — M5136 Other intervertebral disc degeneration, lumbar region: Secondary | ICD-10-CM | POA: Diagnosis not present

## 2014-11-13 DIAGNOSIS — G894 Chronic pain syndrome: Secondary | ICD-10-CM | POA: Diagnosis not present

## 2014-11-13 DIAGNOSIS — G609 Hereditary and idiopathic neuropathy, unspecified: Secondary | ICD-10-CM | POA: Diagnosis not present

## 2014-12-01 IMAGING — CT CT HEAD W/O CM
2 series · 16 of 30 positions shown, 20 images · non-contrast
Comparison: [facility] CT 03/05/2006.

CLINICAL DATA: 36-year-old female with dizziness near syncope.

CT HEAD WITHOUT CONTRAST
TECHNIQUE: Contiguous axial images were obtained from the base of
the skull through the vertex without contrast.

[Series 2: head w/o · axial · non-contrast · 0.43mm/px · z∈[-36,+89]mm · 13 of 31 slices shown, 17 images]
[im 3/31  brain]
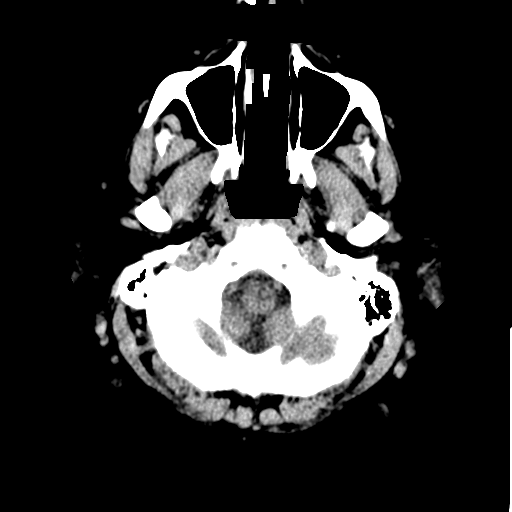
[im 3/31  bone]
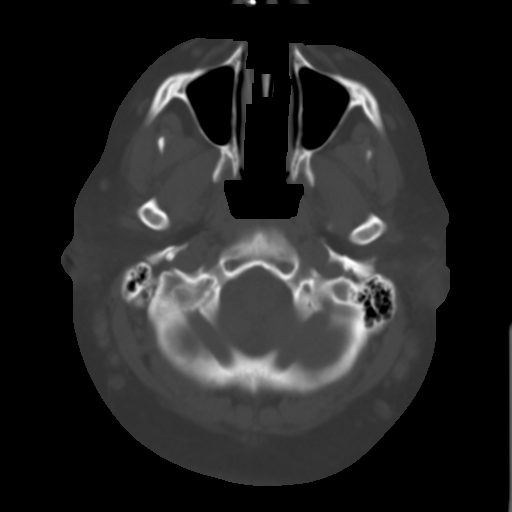
[im 5/31  brain]
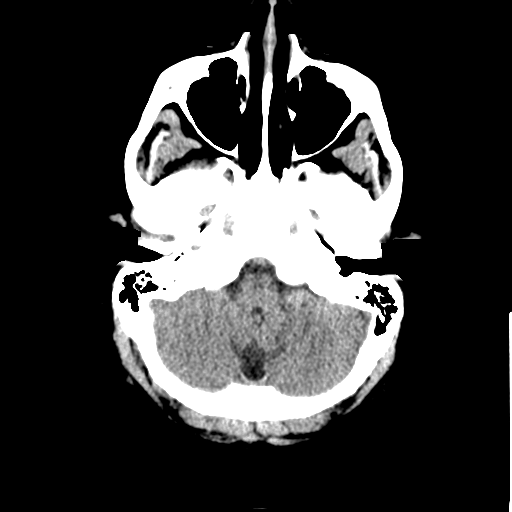
[im 7/31  brain]
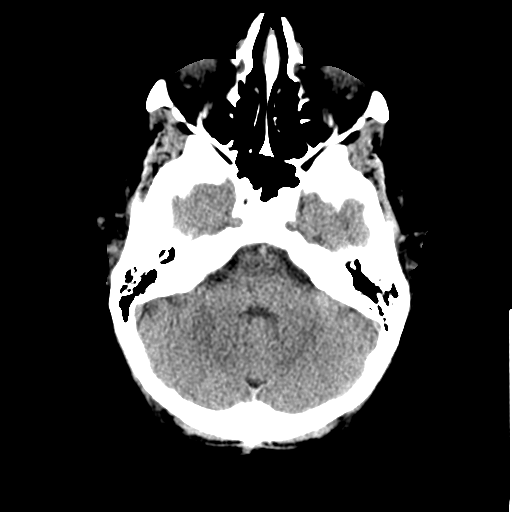
[im 9/31  brain]
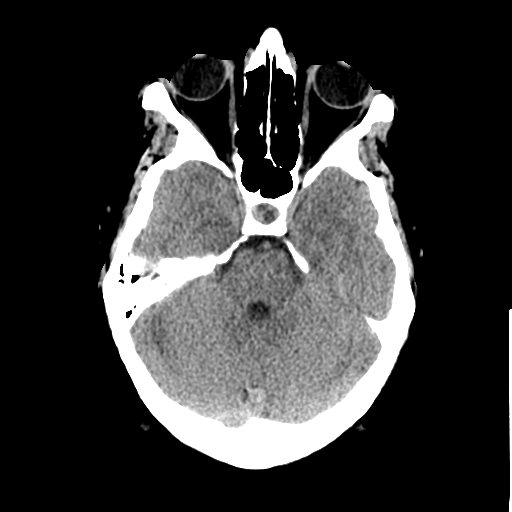
[im 11/31  brain]
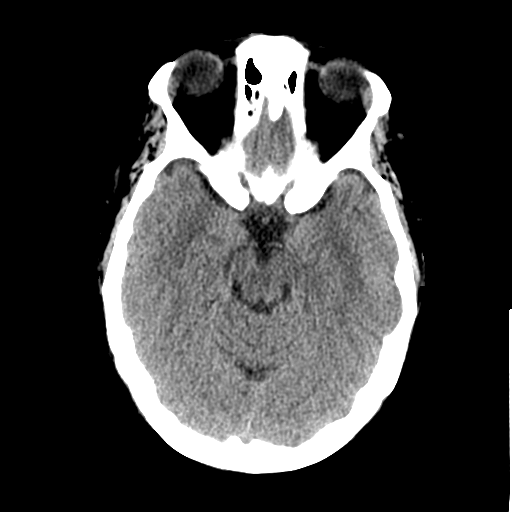
[im 11/31  bone]
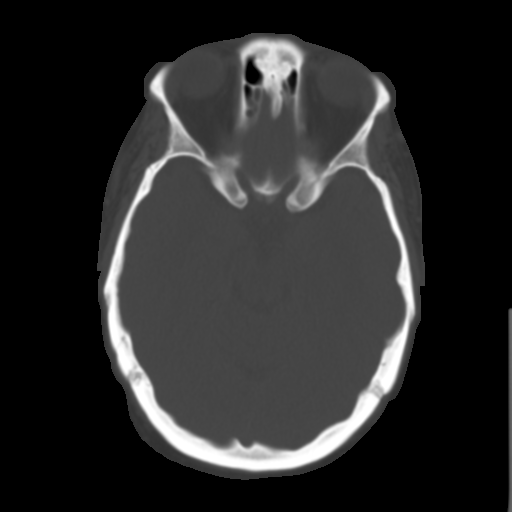
[im 13/31  brain]
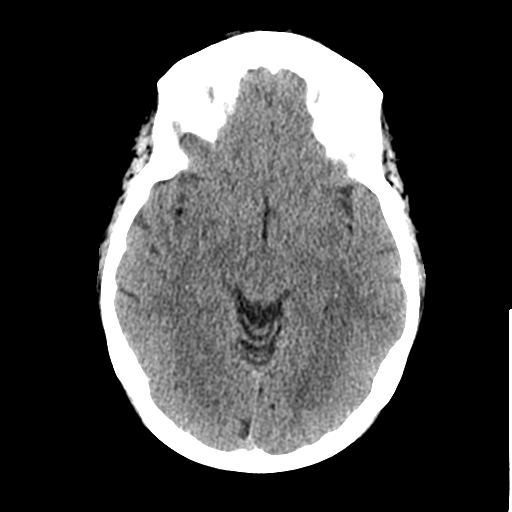
[im 16/31  brain]
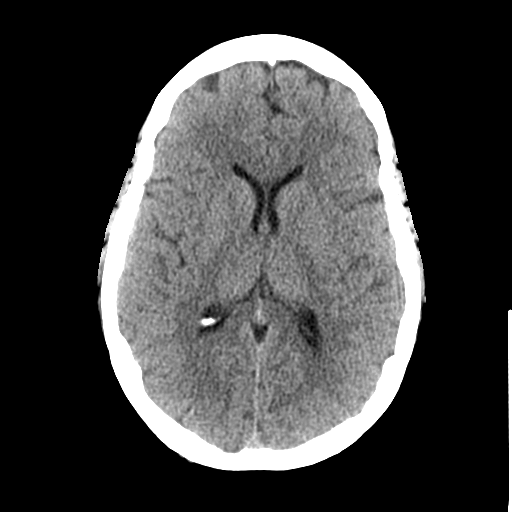
[im 18/31  brain]
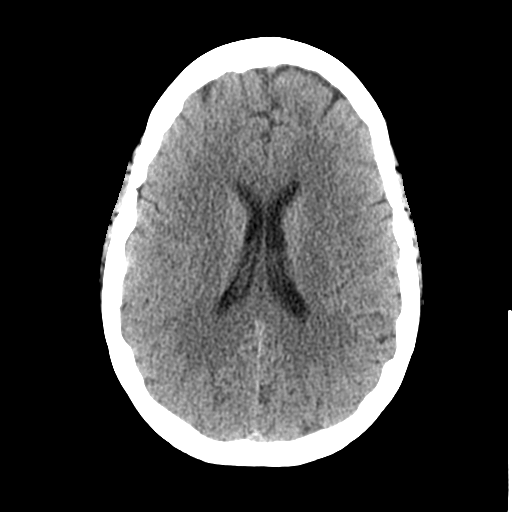
[im 20/31  brain]
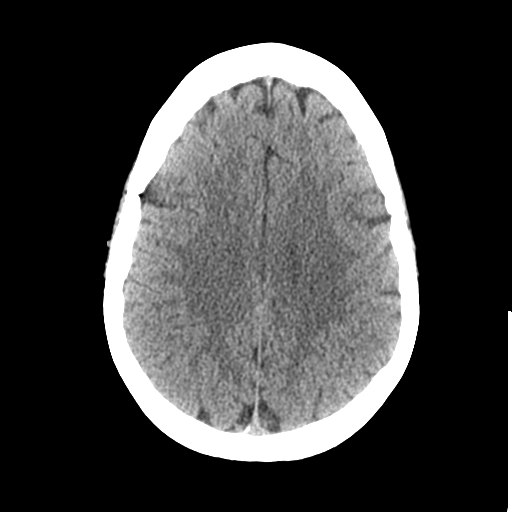
[im 20/31  bone]
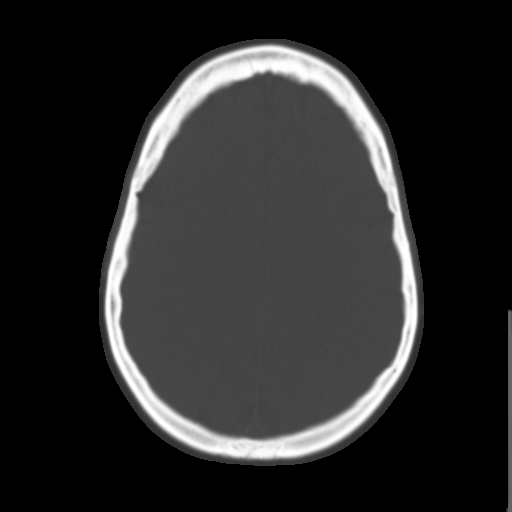
[im 22/31  brain]
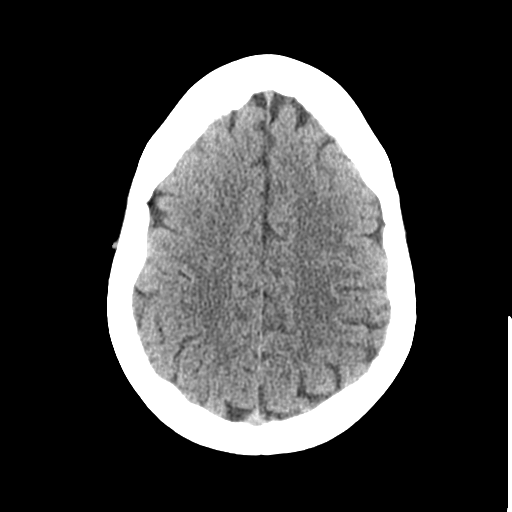
[im 24/31  brain]
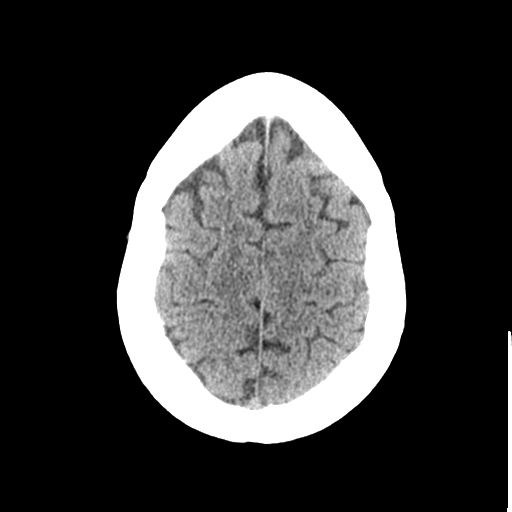
[im 26/31  brain]
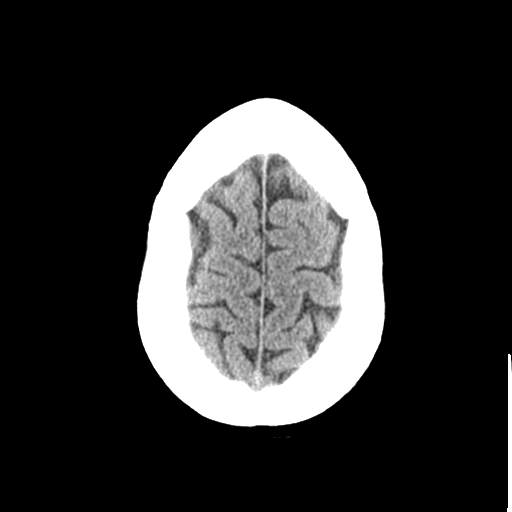
[im 28/31  brain]
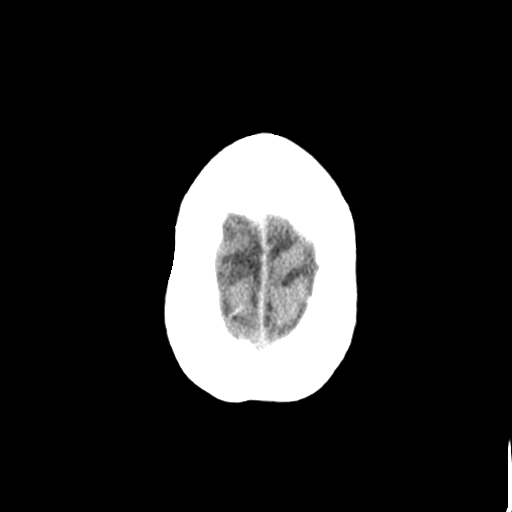
[im 28/31  bone]
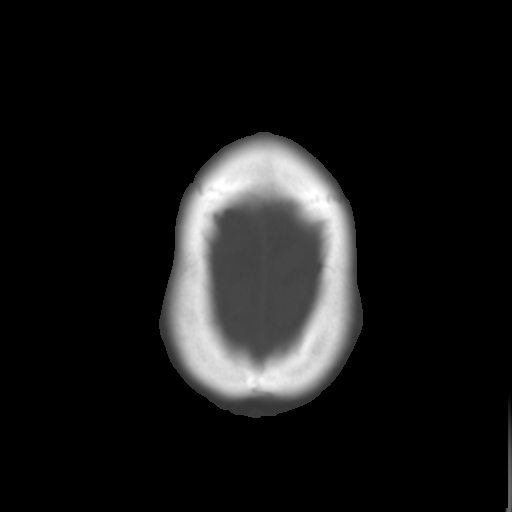

[Series 3: bone windows · axial · 0.43mm/px · z∈[-36,+4]mm · 3 of 31 slices shown]
[im 3/31  bone]
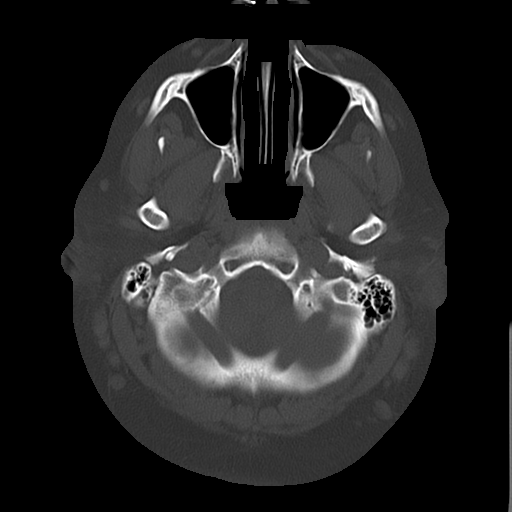
[im 7/31  bone]
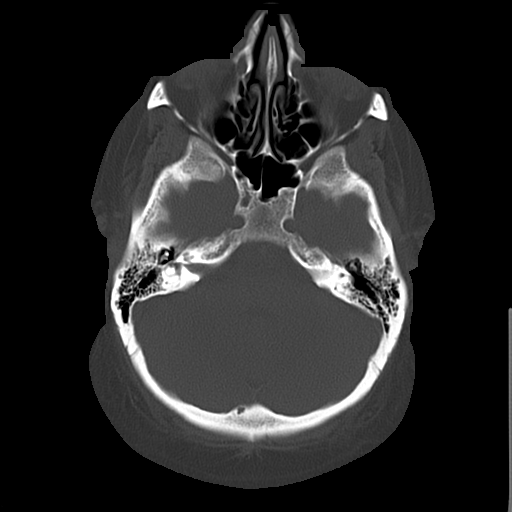
[im 11/31  bone]
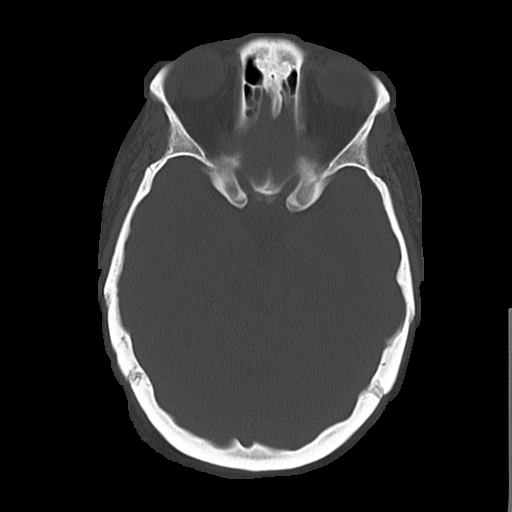

[16 of 30 positions shown; findings below may reference images not displayed]

FINDINGS: Visualized paranasal sinuses and mastoids are clear.
Visualized orbits and scalp soft tissues are within normal limits.
No acute osseous abnormality identified.

Normal cerebral volume.  No midline shift, ventriculomegaly, mass
effect, evidence of mass lesion, intracranial hemorrhage or
evidence of cortically based acute infarction.  Gray-white matter
differentiation is within normal limits throughout the brain.  No
suspicious intracranial vascular hyperdensity.
IMPRESSION: Stable and normal noncontrast CT appearance of the brain.

## 2014-12-04 DIAGNOSIS — E1142 Type 2 diabetes mellitus with diabetic polyneuropathy: Secondary | ICD-10-CM | POA: Diagnosis not present

## 2014-12-04 DIAGNOSIS — F508 Other eating disorders: Secondary | ICD-10-CM | POA: Diagnosis not present

## 2014-12-04 DIAGNOSIS — M159 Polyosteoarthritis, unspecified: Secondary | ICD-10-CM | POA: Diagnosis not present

## 2014-12-04 DIAGNOSIS — M797 Fibromyalgia: Secondary | ICD-10-CM | POA: Diagnosis not present

## 2014-12-04 DIAGNOSIS — E1165 Type 2 diabetes mellitus with hyperglycemia: Secondary | ICD-10-CM | POA: Diagnosis not present

## 2014-12-04 DIAGNOSIS — J309 Allergic rhinitis, unspecified: Secondary | ICD-10-CM | POA: Diagnosis not present

## 2014-12-04 DIAGNOSIS — R51 Headache: Secondary | ICD-10-CM | POA: Diagnosis not present

## 2014-12-04 DIAGNOSIS — R600 Localized edema: Secondary | ICD-10-CM | POA: Diagnosis not present

## 2014-12-04 DIAGNOSIS — R945 Abnormal results of liver function studies: Secondary | ICD-10-CM | POA: Diagnosis not present

## 2014-12-04 DIAGNOSIS — E780 Pure hypercholesterolemia: Secondary | ICD-10-CM | POA: Diagnosis not present

## 2014-12-04 DIAGNOSIS — F329 Major depressive disorder, single episode, unspecified: Secondary | ICD-10-CM | POA: Diagnosis not present

## 2014-12-04 DIAGNOSIS — E039 Hypothyroidism, unspecified: Secondary | ICD-10-CM | POA: Diagnosis not present

## 2014-12-07 DIAGNOSIS — Z8249 Family history of ischemic heart disease and other diseases of the circulatory system: Secondary | ICD-10-CM | POA: Diagnosis not present

## 2014-12-07 DIAGNOSIS — Z825 Family history of asthma and other chronic lower respiratory diseases: Secondary | ICD-10-CM | POA: Diagnosis not present

## 2014-12-07 DIAGNOSIS — G629 Polyneuropathy, unspecified: Secondary | ICD-10-CM | POA: Diagnosis not present

## 2014-12-07 DIAGNOSIS — I10 Essential (primary) hypertension: Secondary | ICD-10-CM | POA: Diagnosis not present

## 2014-12-07 DIAGNOSIS — G894 Chronic pain syndrome: Secondary | ICD-10-CM | POA: Diagnosis not present

## 2014-12-07 DIAGNOSIS — E039 Hypothyroidism, unspecified: Secondary | ICD-10-CM | POA: Diagnosis not present

## 2014-12-07 DIAGNOSIS — Z87891 Personal history of nicotine dependence: Secondary | ICD-10-CM | POA: Diagnosis not present

## 2014-12-07 DIAGNOSIS — M5417 Radiculopathy, lumbosacral region: Secondary | ICD-10-CM | POA: Diagnosis not present

## 2014-12-07 DIAGNOSIS — E119 Type 2 diabetes mellitus without complications: Secondary | ICD-10-CM | POA: Diagnosis not present

## 2014-12-07 DIAGNOSIS — Z6841 Body Mass Index (BMI) 40.0 and over, adult: Secondary | ICD-10-CM | POA: Diagnosis not present

## 2014-12-07 DIAGNOSIS — Z833 Family history of diabetes mellitus: Secondary | ICD-10-CM | POA: Diagnosis not present

## 2014-12-07 DIAGNOSIS — G8929 Other chronic pain: Secondary | ICD-10-CM | POA: Diagnosis not present

## 2014-12-07 DIAGNOSIS — Z794 Long term (current) use of insulin: Secondary | ICD-10-CM | POA: Diagnosis not present

## 2015-01-23 DIAGNOSIS — R945 Abnormal results of liver function studies: Secondary | ICD-10-CM | POA: Diagnosis not present

## 2015-01-23 DIAGNOSIS — E1165 Type 2 diabetes mellitus with hyperglycemia: Secondary | ICD-10-CM | POA: Diagnosis not present

## 2015-01-23 DIAGNOSIS — F508 Other eating disorders: Secondary | ICD-10-CM | POA: Diagnosis not present

## 2015-01-23 DIAGNOSIS — E1142 Type 2 diabetes mellitus with diabetic polyneuropathy: Secondary | ICD-10-CM | POA: Diagnosis not present

## 2015-01-23 DIAGNOSIS — M797 Fibromyalgia: Secondary | ICD-10-CM | POA: Diagnosis not present

## 2015-01-23 DIAGNOSIS — J309 Allergic rhinitis, unspecified: Secondary | ICD-10-CM | POA: Diagnosis not present

## 2015-01-23 DIAGNOSIS — E780 Pure hypercholesterolemia: Secondary | ICD-10-CM | POA: Diagnosis not present

## 2015-01-23 DIAGNOSIS — F329 Major depressive disorder, single episode, unspecified: Secondary | ICD-10-CM | POA: Diagnosis not present

## 2015-01-23 DIAGNOSIS — R600 Localized edema: Secondary | ICD-10-CM | POA: Diagnosis not present

## 2015-01-23 DIAGNOSIS — R51 Headache: Secondary | ICD-10-CM | POA: Diagnosis not present

## 2015-01-23 DIAGNOSIS — Z6841 Body Mass Index (BMI) 40.0 and over, adult: Secondary | ICD-10-CM | POA: Diagnosis not present

## 2015-02-20 DIAGNOSIS — E1165 Type 2 diabetes mellitus with hyperglycemia: Secondary | ICD-10-CM | POA: Diagnosis not present

## 2015-02-20 DIAGNOSIS — E780 Pure hypercholesterolemia: Secondary | ICD-10-CM | POA: Diagnosis not present

## 2015-02-20 DIAGNOSIS — E039 Hypothyroidism, unspecified: Secondary | ICD-10-CM | POA: Diagnosis not present

## 2015-02-20 DIAGNOSIS — F508 Other eating disorders: Secondary | ICD-10-CM | POA: Diagnosis not present

## 2015-02-20 DIAGNOSIS — R945 Abnormal results of liver function studies: Secondary | ICD-10-CM | POA: Diagnosis not present

## 2015-02-20 DIAGNOSIS — M159 Polyosteoarthritis, unspecified: Secondary | ICD-10-CM | POA: Diagnosis not present

## 2015-02-20 DIAGNOSIS — J309 Allergic rhinitis, unspecified: Secondary | ICD-10-CM | POA: Diagnosis not present

## 2015-02-20 DIAGNOSIS — R600 Localized edema: Secondary | ICD-10-CM | POA: Diagnosis not present

## 2015-02-20 DIAGNOSIS — F329 Major depressive disorder, single episode, unspecified: Secondary | ICD-10-CM | POA: Diagnosis not present

## 2015-02-20 DIAGNOSIS — E1142 Type 2 diabetes mellitus with diabetic polyneuropathy: Secondary | ICD-10-CM | POA: Diagnosis not present

## 2015-02-20 DIAGNOSIS — M797 Fibromyalgia: Secondary | ICD-10-CM | POA: Diagnosis not present

## 2015-03-03 DIAGNOSIS — Z5181 Encounter for therapeutic drug level monitoring: Secondary | ICD-10-CM | POA: Diagnosis not present

## 2015-03-03 DIAGNOSIS — G609 Hereditary and idiopathic neuropathy, unspecified: Secondary | ICD-10-CM | POA: Diagnosis not present

## 2015-03-03 DIAGNOSIS — E0840 Diabetes mellitus due to underlying condition with diabetic neuropathy, unspecified: Secondary | ICD-10-CM | POA: Diagnosis not present

## 2015-03-03 DIAGNOSIS — G894 Chronic pain syndrome: Secondary | ICD-10-CM | POA: Diagnosis not present

## 2015-03-03 DIAGNOSIS — Z79899 Other long term (current) drug therapy: Secondary | ICD-10-CM | POA: Diagnosis not present

## 2015-03-03 DIAGNOSIS — M5440 Lumbago with sciatica, unspecified side: Secondary | ICD-10-CM | POA: Diagnosis not present

## 2015-03-04 DIAGNOSIS — E1165 Type 2 diabetes mellitus with hyperglycemia: Secondary | ICD-10-CM | POA: Diagnosis not present

## 2015-03-04 DIAGNOSIS — L0390 Cellulitis, unspecified: Secondary | ICD-10-CM | POA: Diagnosis not present

## 2015-03-20 DIAGNOSIS — E780 Pure hypercholesterolemia: Secondary | ICD-10-CM | POA: Diagnosis not present

## 2015-03-20 DIAGNOSIS — R51 Headache: Secondary | ICD-10-CM | POA: Diagnosis not present

## 2015-03-20 DIAGNOSIS — F508 Other eating disorders: Secondary | ICD-10-CM | POA: Diagnosis not present

## 2015-03-20 DIAGNOSIS — F329 Major depressive disorder, single episode, unspecified: Secondary | ICD-10-CM | POA: Diagnosis not present

## 2015-03-20 DIAGNOSIS — R945 Abnormal results of liver function studies: Secondary | ICD-10-CM | POA: Diagnosis not present

## 2015-03-20 DIAGNOSIS — M797 Fibromyalgia: Secondary | ICD-10-CM | POA: Diagnosis not present

## 2015-03-20 DIAGNOSIS — M159 Polyosteoarthritis, unspecified: Secondary | ICD-10-CM | POA: Diagnosis not present

## 2015-03-20 DIAGNOSIS — J208 Acute bronchitis due to other specified organisms: Secondary | ICD-10-CM | POA: Diagnosis not present

## 2015-03-20 DIAGNOSIS — E039 Hypothyroidism, unspecified: Secondary | ICD-10-CM | POA: Diagnosis not present

## 2015-03-20 DIAGNOSIS — E1165 Type 2 diabetes mellitus with hyperglycemia: Secondary | ICD-10-CM | POA: Diagnosis not present

## 2015-03-20 DIAGNOSIS — R600 Localized edema: Secondary | ICD-10-CM | POA: Diagnosis not present

## 2015-04-03 DIAGNOSIS — M159 Polyosteoarthritis, unspecified: Secondary | ICD-10-CM | POA: Diagnosis not present

## 2015-04-03 DIAGNOSIS — E1142 Type 2 diabetes mellitus with diabetic polyneuropathy: Secondary | ICD-10-CM | POA: Diagnosis not present

## 2015-04-03 DIAGNOSIS — R945 Abnormal results of liver function studies: Secondary | ICD-10-CM | POA: Diagnosis not present

## 2015-04-03 DIAGNOSIS — F508 Other eating disorders: Secondary | ICD-10-CM | POA: Diagnosis not present

## 2015-04-03 DIAGNOSIS — F329 Major depressive disorder, single episode, unspecified: Secondary | ICD-10-CM | POA: Diagnosis not present

## 2015-04-03 DIAGNOSIS — R600 Localized edema: Secondary | ICD-10-CM | POA: Diagnosis not present

## 2015-04-03 DIAGNOSIS — M797 Fibromyalgia: Secondary | ICD-10-CM | POA: Diagnosis not present

## 2015-04-03 DIAGNOSIS — E780 Pure hypercholesterolemia: Secondary | ICD-10-CM | POA: Diagnosis not present

## 2015-04-17 DIAGNOSIS — M797 Fibromyalgia: Secondary | ICD-10-CM | POA: Diagnosis not present

## 2015-04-17 DIAGNOSIS — E039 Hypothyroidism, unspecified: Secondary | ICD-10-CM | POA: Diagnosis not present

## 2015-04-17 DIAGNOSIS — R945 Abnormal results of liver function studies: Secondary | ICD-10-CM | POA: Diagnosis not present

## 2015-04-17 DIAGNOSIS — M159 Polyosteoarthritis, unspecified: Secondary | ICD-10-CM | POA: Diagnosis not present

## 2015-04-17 DIAGNOSIS — R600 Localized edema: Secondary | ICD-10-CM | POA: Diagnosis not present

## 2015-04-17 DIAGNOSIS — R51 Headache: Secondary | ICD-10-CM | POA: Diagnosis not present

## 2015-04-17 DIAGNOSIS — F329 Major depressive disorder, single episode, unspecified: Secondary | ICD-10-CM | POA: Diagnosis not present

## 2015-04-17 DIAGNOSIS — E1142 Type 2 diabetes mellitus with diabetic polyneuropathy: Secondary | ICD-10-CM | POA: Diagnosis not present

## 2015-04-19 DIAGNOSIS — E7800 Pure hypercholesterolemia, unspecified: Secondary | ICD-10-CM | POA: Diagnosis not present

## 2015-04-19 DIAGNOSIS — E039 Hypothyroidism, unspecified: Secondary | ICD-10-CM | POA: Diagnosis not present

## 2015-04-30 DIAGNOSIS — G609 Hereditary and idiopathic neuropathy, unspecified: Secondary | ICD-10-CM | POA: Diagnosis not present

## 2015-04-30 DIAGNOSIS — M5440 Lumbago with sciatica, unspecified side: Secondary | ICD-10-CM | POA: Diagnosis not present

## 2015-04-30 DIAGNOSIS — M5442 Lumbago with sciatica, left side: Secondary | ICD-10-CM | POA: Diagnosis not present

## 2015-04-30 DIAGNOSIS — G894 Chronic pain syndrome: Secondary | ICD-10-CM | POA: Diagnosis not present

## 2015-05-01 DIAGNOSIS — M159 Polyosteoarthritis, unspecified: Secondary | ICD-10-CM | POA: Diagnosis not present

## 2015-05-01 DIAGNOSIS — F329 Major depressive disorder, single episode, unspecified: Secondary | ICD-10-CM | POA: Diagnosis not present

## 2015-05-01 DIAGNOSIS — E039 Hypothyroidism, unspecified: Secondary | ICD-10-CM | POA: Diagnosis not present

## 2015-05-01 DIAGNOSIS — F5081 Binge eating disorder: Secondary | ICD-10-CM | POA: Diagnosis not present

## 2015-05-01 DIAGNOSIS — R600 Localized edema: Secondary | ICD-10-CM | POA: Diagnosis not present

## 2015-05-01 DIAGNOSIS — E1165 Type 2 diabetes mellitus with hyperglycemia: Secondary | ICD-10-CM | POA: Diagnosis not present

## 2015-05-01 DIAGNOSIS — E7800 Pure hypercholesterolemia, unspecified: Secondary | ICD-10-CM | POA: Diagnosis not present

## 2015-05-01 DIAGNOSIS — R51 Headache: Secondary | ICD-10-CM | POA: Diagnosis not present

## 2015-05-01 DIAGNOSIS — E1142 Type 2 diabetes mellitus with diabetic polyneuropathy: Secondary | ICD-10-CM | POA: Diagnosis not present

## 2015-05-01 DIAGNOSIS — R945 Abnormal results of liver function studies: Secondary | ICD-10-CM | POA: Diagnosis not present

## 2015-05-01 DIAGNOSIS — M797 Fibromyalgia: Secondary | ICD-10-CM | POA: Diagnosis not present

## 2015-05-28 DIAGNOSIS — L02415 Cutaneous abscess of right lower limb: Secondary | ICD-10-CM | POA: Diagnosis not present

## 2015-05-28 DIAGNOSIS — E1165 Type 2 diabetes mellitus with hyperglycemia: Secondary | ICD-10-CM | POA: Diagnosis not present

## 2015-05-28 DIAGNOSIS — L03115 Cellulitis of right lower limb: Secondary | ICD-10-CM | POA: Diagnosis not present

## 2015-05-29 DIAGNOSIS — E1142 Type 2 diabetes mellitus with diabetic polyneuropathy: Secondary | ICD-10-CM | POA: Diagnosis not present

## 2015-05-29 DIAGNOSIS — Z23 Encounter for immunization: Secondary | ICD-10-CM | POA: Diagnosis not present

## 2015-05-29 DIAGNOSIS — E1165 Type 2 diabetes mellitus with hyperglycemia: Secondary | ICD-10-CM | POA: Diagnosis not present

## 2015-05-29 DIAGNOSIS — R51 Headache: Secondary | ICD-10-CM | POA: Diagnosis not present

## 2015-05-29 DIAGNOSIS — E039 Hypothyroidism, unspecified: Secondary | ICD-10-CM | POA: Diagnosis not present

## 2015-05-29 DIAGNOSIS — F329 Major depressive disorder, single episode, unspecified: Secondary | ICD-10-CM | POA: Diagnosis not present

## 2015-05-29 DIAGNOSIS — R945 Abnormal results of liver function studies: Secondary | ICD-10-CM | POA: Diagnosis not present

## 2015-05-29 DIAGNOSIS — F5081 Binge eating disorder: Secondary | ICD-10-CM | POA: Diagnosis not present

## 2015-05-29 DIAGNOSIS — E7800 Pure hypercholesterolemia, unspecified: Secondary | ICD-10-CM | POA: Diagnosis not present

## 2015-05-29 DIAGNOSIS — M797 Fibromyalgia: Secondary | ICD-10-CM | POA: Diagnosis not present

## 2015-05-29 DIAGNOSIS — M159 Polyosteoarthritis, unspecified: Secondary | ICD-10-CM | POA: Diagnosis not present

## 2015-06-05 DIAGNOSIS — E1142 Type 2 diabetes mellitus with diabetic polyneuropathy: Secondary | ICD-10-CM | POA: Diagnosis not present

## 2015-06-05 DIAGNOSIS — E1165 Type 2 diabetes mellitus with hyperglycemia: Secondary | ICD-10-CM | POA: Diagnosis not present

## 2015-06-05 DIAGNOSIS — M159 Polyosteoarthritis, unspecified: Secondary | ICD-10-CM | POA: Diagnosis not present

## 2015-06-05 DIAGNOSIS — M797 Fibromyalgia: Secondary | ICD-10-CM | POA: Diagnosis not present

## 2015-06-05 DIAGNOSIS — E039 Hypothyroidism, unspecified: Secondary | ICD-10-CM | POA: Diagnosis not present

## 2015-06-05 DIAGNOSIS — R945 Abnormal results of liver function studies: Secondary | ICD-10-CM | POA: Diagnosis not present

## 2015-06-05 DIAGNOSIS — E7800 Pure hypercholesterolemia, unspecified: Secondary | ICD-10-CM | POA: Diagnosis not present

## 2015-06-05 DIAGNOSIS — R600 Localized edema: Secondary | ICD-10-CM | POA: Diagnosis not present

## 2015-06-05 DIAGNOSIS — R51 Headache: Secondary | ICD-10-CM | POA: Diagnosis not present

## 2015-06-05 DIAGNOSIS — F329 Major depressive disorder, single episode, unspecified: Secondary | ICD-10-CM | POA: Diagnosis not present

## 2015-06-05 DIAGNOSIS — F5081 Binge eating disorder: Secondary | ICD-10-CM | POA: Diagnosis not present

## 2015-06-26 DIAGNOSIS — E7800 Pure hypercholesterolemia, unspecified: Secondary | ICD-10-CM | POA: Diagnosis not present

## 2015-06-26 DIAGNOSIS — F329 Major depressive disorder, single episode, unspecified: Secondary | ICD-10-CM | POA: Diagnosis not present

## 2015-06-26 DIAGNOSIS — M159 Polyosteoarthritis, unspecified: Secondary | ICD-10-CM | POA: Diagnosis not present

## 2015-06-26 DIAGNOSIS — M797 Fibromyalgia: Secondary | ICD-10-CM | POA: Diagnosis not present

## 2015-06-26 DIAGNOSIS — F5081 Binge eating disorder: Secondary | ICD-10-CM | POA: Diagnosis not present

## 2015-06-26 DIAGNOSIS — L0390 Cellulitis, unspecified: Secondary | ICD-10-CM | POA: Diagnosis not present

## 2015-06-26 DIAGNOSIS — R945 Abnormal results of liver function studies: Secondary | ICD-10-CM | POA: Diagnosis not present

## 2015-06-26 DIAGNOSIS — R600 Localized edema: Secondary | ICD-10-CM | POA: Diagnosis not present

## 2015-06-26 DIAGNOSIS — E039 Hypothyroidism, unspecified: Secondary | ICD-10-CM | POA: Diagnosis not present

## 2015-06-26 DIAGNOSIS — E1165 Type 2 diabetes mellitus with hyperglycemia: Secondary | ICD-10-CM | POA: Diagnosis not present

## 2015-06-26 DIAGNOSIS — E1142 Type 2 diabetes mellitus with diabetic polyneuropathy: Secondary | ICD-10-CM | POA: Diagnosis not present

## 2015-06-26 DIAGNOSIS — R51 Headache: Secondary | ICD-10-CM | POA: Diagnosis not present

## 2015-06-30 DIAGNOSIS — M5440 Lumbago with sciatica, unspecified side: Secondary | ICD-10-CM | POA: Diagnosis not present

## 2015-06-30 DIAGNOSIS — G609 Hereditary and idiopathic neuropathy, unspecified: Secondary | ICD-10-CM | POA: Diagnosis not present

## 2015-06-30 DIAGNOSIS — M5442 Lumbago with sciatica, left side: Secondary | ICD-10-CM | POA: Diagnosis not present

## 2015-06-30 DIAGNOSIS — G894 Chronic pain syndrome: Secondary | ICD-10-CM | POA: Diagnosis not present

## 2015-07-30 DIAGNOSIS — E1142 Type 2 diabetes mellitus with diabetic polyneuropathy: Secondary | ICD-10-CM | POA: Diagnosis not present

## 2015-07-30 DIAGNOSIS — E7800 Pure hypercholesterolemia, unspecified: Secondary | ICD-10-CM | POA: Diagnosis not present

## 2015-07-30 DIAGNOSIS — M797 Fibromyalgia: Secondary | ICD-10-CM | POA: Diagnosis not present

## 2015-07-30 DIAGNOSIS — F5081 Binge eating disorder: Secondary | ICD-10-CM | POA: Diagnosis not present

## 2015-07-30 DIAGNOSIS — Z1231 Encounter for screening mammogram for malignant neoplasm of breast: Secondary | ICD-10-CM | POA: Diagnosis not present

## 2015-07-30 DIAGNOSIS — R600 Localized edema: Secondary | ICD-10-CM | POA: Diagnosis not present

## 2015-07-30 DIAGNOSIS — M159 Polyosteoarthritis, unspecified: Secondary | ICD-10-CM | POA: Diagnosis not present

## 2015-07-30 DIAGNOSIS — E1165 Type 2 diabetes mellitus with hyperglycemia: Secondary | ICD-10-CM | POA: Diagnosis not present

## 2015-07-30 DIAGNOSIS — R51 Headache: Secondary | ICD-10-CM | POA: Diagnosis not present

## 2015-07-30 DIAGNOSIS — F329 Major depressive disorder, single episode, unspecified: Secondary | ICD-10-CM | POA: Diagnosis not present

## 2015-07-30 DIAGNOSIS — E039 Hypothyroidism, unspecified: Secondary | ICD-10-CM | POA: Diagnosis not present

## 2015-07-30 DIAGNOSIS — R945 Abnormal results of liver function studies: Secondary | ICD-10-CM | POA: Diagnosis not present

## 2015-08-06 DIAGNOSIS — Z1231 Encounter for screening mammogram for malignant neoplasm of breast: Secondary | ICD-10-CM | POA: Diagnosis not present

## 2015-08-19 DIAGNOSIS — M797 Fibromyalgia: Secondary | ICD-10-CM | POA: Diagnosis not present

## 2015-08-19 DIAGNOSIS — E7800 Pure hypercholesterolemia, unspecified: Secondary | ICD-10-CM | POA: Diagnosis not present

## 2015-08-19 DIAGNOSIS — M65841 Other synovitis and tenosynovitis, right hand: Secondary | ICD-10-CM | POA: Diagnosis not present

## 2015-08-19 DIAGNOSIS — E1142 Type 2 diabetes mellitus with diabetic polyneuropathy: Secondary | ICD-10-CM | POA: Diagnosis not present

## 2015-08-19 DIAGNOSIS — R945 Abnormal results of liver function studies: Secondary | ICD-10-CM | POA: Diagnosis not present

## 2015-08-19 DIAGNOSIS — E1165 Type 2 diabetes mellitus with hyperglycemia: Secondary | ICD-10-CM | POA: Diagnosis not present

## 2015-08-19 DIAGNOSIS — R51 Headache: Secondary | ICD-10-CM | POA: Diagnosis not present

## 2015-08-19 DIAGNOSIS — M159 Polyosteoarthritis, unspecified: Secondary | ICD-10-CM | POA: Diagnosis not present

## 2015-08-19 DIAGNOSIS — E039 Hypothyroidism, unspecified: Secondary | ICD-10-CM | POA: Diagnosis not present

## 2015-08-19 DIAGNOSIS — R600 Localized edema: Secondary | ICD-10-CM | POA: Diagnosis not present

## 2015-08-19 DIAGNOSIS — F329 Major depressive disorder, single episode, unspecified: Secondary | ICD-10-CM | POA: Diagnosis not present

## 2015-08-19 DIAGNOSIS — F5081 Binge eating disorder: Secondary | ICD-10-CM | POA: Diagnosis not present

## 2015-08-30 DIAGNOSIS — G609 Hereditary and idiopathic neuropathy, unspecified: Secondary | ICD-10-CM | POA: Diagnosis not present

## 2015-08-30 DIAGNOSIS — M5440 Lumbago with sciatica, unspecified side: Secondary | ICD-10-CM | POA: Diagnosis not present

## 2015-08-30 DIAGNOSIS — M5442 Lumbago with sciatica, left side: Secondary | ICD-10-CM | POA: Diagnosis not present

## 2015-08-30 DIAGNOSIS — G894 Chronic pain syndrome: Secondary | ICD-10-CM | POA: Diagnosis not present

## 2015-11-01 DIAGNOSIS — M159 Polyosteoarthritis, unspecified: Secondary | ICD-10-CM | POA: Diagnosis not present

## 2015-11-01 DIAGNOSIS — F5081 Binge eating disorder: Secondary | ICD-10-CM | POA: Diagnosis not present

## 2015-11-01 DIAGNOSIS — E7800 Pure hypercholesterolemia, unspecified: Secondary | ICD-10-CM | POA: Diagnosis not present

## 2015-11-01 DIAGNOSIS — E1142 Type 2 diabetes mellitus with diabetic polyneuropathy: Secondary | ICD-10-CM | POA: Diagnosis not present

## 2015-11-01 DIAGNOSIS — F329 Major depressive disorder, single episode, unspecified: Secondary | ICD-10-CM | POA: Diagnosis not present

## 2015-11-01 DIAGNOSIS — E039 Hypothyroidism, unspecified: Secondary | ICD-10-CM | POA: Diagnosis not present

## 2015-11-01 DIAGNOSIS — R945 Abnormal results of liver function studies: Secondary | ICD-10-CM | POA: Diagnosis not present

## 2015-11-01 DIAGNOSIS — R600 Localized edema: Secondary | ICD-10-CM | POA: Diagnosis not present

## 2015-11-01 DIAGNOSIS — E1165 Type 2 diabetes mellitus with hyperglycemia: Secondary | ICD-10-CM | POA: Diagnosis not present

## 2015-11-01 DIAGNOSIS — M797 Fibromyalgia: Secondary | ICD-10-CM | POA: Diagnosis not present

## 2015-11-01 DIAGNOSIS — R51 Headache: Secondary | ICD-10-CM | POA: Diagnosis not present

## 2015-11-16 DIAGNOSIS — Z79891 Long term (current) use of opiate analgesic: Secondary | ICD-10-CM | POA: Diagnosis not present

## 2015-11-16 DIAGNOSIS — M5440 Lumbago with sciatica, unspecified side: Secondary | ICD-10-CM | POA: Diagnosis not present

## 2015-11-16 DIAGNOSIS — G609 Hereditary and idiopathic neuropathy, unspecified: Secondary | ICD-10-CM | POA: Diagnosis not present

## 2015-11-16 DIAGNOSIS — G894 Chronic pain syndrome: Secondary | ICD-10-CM | POA: Diagnosis not present

## 2015-11-16 DIAGNOSIS — G8929 Other chronic pain: Secondary | ICD-10-CM | POA: Diagnosis not present

## 2015-11-29 DIAGNOSIS — M797 Fibromyalgia: Secondary | ICD-10-CM | POA: Diagnosis not present

## 2015-11-29 DIAGNOSIS — F5081 Binge eating disorder: Secondary | ICD-10-CM | POA: Diagnosis not present

## 2015-11-29 DIAGNOSIS — E039 Hypothyroidism, unspecified: Secondary | ICD-10-CM | POA: Diagnosis not present

## 2015-11-29 DIAGNOSIS — R945 Abnormal results of liver function studies: Secondary | ICD-10-CM | POA: Diagnosis not present

## 2015-11-29 DIAGNOSIS — E7800 Pure hypercholesterolemia, unspecified: Secondary | ICD-10-CM | POA: Diagnosis not present

## 2015-11-29 DIAGNOSIS — F5101 Primary insomnia: Secondary | ICD-10-CM | POA: Diagnosis not present

## 2015-11-29 DIAGNOSIS — R51 Headache: Secondary | ICD-10-CM | POA: Diagnosis not present

## 2015-11-29 DIAGNOSIS — R600 Localized edema: Secondary | ICD-10-CM | POA: Diagnosis not present

## 2015-11-29 DIAGNOSIS — M159 Polyosteoarthritis, unspecified: Secondary | ICD-10-CM | POA: Diagnosis not present

## 2015-11-29 DIAGNOSIS — Z1389 Encounter for screening for other disorder: Secondary | ICD-10-CM | POA: Diagnosis not present

## 2015-11-29 DIAGNOSIS — E1165 Type 2 diabetes mellitus with hyperglycemia: Secondary | ICD-10-CM | POA: Diagnosis not present

## 2015-11-29 DIAGNOSIS — E1142 Type 2 diabetes mellitus with diabetic polyneuropathy: Secondary | ICD-10-CM | POA: Diagnosis not present

## 2015-12-20 DIAGNOSIS — F329 Major depressive disorder, single episode, unspecified: Secondary | ICD-10-CM | POA: Diagnosis not present

## 2015-12-20 DIAGNOSIS — F5101 Primary insomnia: Secondary | ICD-10-CM | POA: Diagnosis not present

## 2015-12-20 DIAGNOSIS — E1142 Type 2 diabetes mellitus with diabetic polyneuropathy: Secondary | ICD-10-CM | POA: Diagnosis not present

## 2015-12-20 DIAGNOSIS — E7800 Pure hypercholesterolemia, unspecified: Secondary | ICD-10-CM | POA: Diagnosis not present

## 2015-12-20 DIAGNOSIS — R51 Headache: Secondary | ICD-10-CM | POA: Diagnosis not present

## 2015-12-20 DIAGNOSIS — E039 Hypothyroidism, unspecified: Secondary | ICD-10-CM | POA: Diagnosis not present

## 2015-12-20 DIAGNOSIS — M659 Synovitis and tenosynovitis, unspecified: Secondary | ICD-10-CM | POA: Diagnosis not present

## 2015-12-20 DIAGNOSIS — F5081 Binge eating disorder: Secondary | ICD-10-CM | POA: Diagnosis not present

## 2015-12-20 DIAGNOSIS — E1165 Type 2 diabetes mellitus with hyperglycemia: Secondary | ICD-10-CM | POA: Diagnosis not present

## 2015-12-20 DIAGNOSIS — R600 Localized edema: Secondary | ICD-10-CM | POA: Diagnosis not present

## 2015-12-20 DIAGNOSIS — R945 Abnormal results of liver function studies: Secondary | ICD-10-CM | POA: Diagnosis not present

## 2015-12-20 DIAGNOSIS — M797 Fibromyalgia: Secondary | ICD-10-CM | POA: Diagnosis not present

## 2016-01-07 DIAGNOSIS — G609 Hereditary and idiopathic neuropathy, unspecified: Secondary | ICD-10-CM | POA: Diagnosis not present

## 2016-01-07 DIAGNOSIS — G8929 Other chronic pain: Secondary | ICD-10-CM | POA: Diagnosis not present

## 2016-01-07 DIAGNOSIS — M5440 Lumbago with sciatica, unspecified side: Secondary | ICD-10-CM | POA: Diagnosis not present

## 2016-01-08 DIAGNOSIS — E1142 Type 2 diabetes mellitus with diabetic polyneuropathy: Secondary | ICD-10-CM | POA: Diagnosis not present

## 2016-01-08 DIAGNOSIS — F5101 Primary insomnia: Secondary | ICD-10-CM | POA: Diagnosis not present

## 2016-01-08 DIAGNOSIS — E1165 Type 2 diabetes mellitus with hyperglycemia: Secondary | ICD-10-CM | POA: Diagnosis not present

## 2016-01-08 DIAGNOSIS — M159 Polyosteoarthritis, unspecified: Secondary | ICD-10-CM | POA: Diagnosis not present

## 2016-01-08 DIAGNOSIS — R945 Abnormal results of liver function studies: Secondary | ICD-10-CM | POA: Diagnosis not present

## 2016-01-08 DIAGNOSIS — F329 Major depressive disorder, single episode, unspecified: Secondary | ICD-10-CM | POA: Diagnosis not present

## 2016-01-08 DIAGNOSIS — E7800 Pure hypercholesterolemia, unspecified: Secondary | ICD-10-CM | POA: Diagnosis not present

## 2016-01-08 DIAGNOSIS — E039 Hypothyroidism, unspecified: Secondary | ICD-10-CM | POA: Diagnosis not present

## 2016-01-08 DIAGNOSIS — R51 Headache: Secondary | ICD-10-CM | POA: Diagnosis not present

## 2016-01-08 DIAGNOSIS — M797 Fibromyalgia: Secondary | ICD-10-CM | POA: Diagnosis not present

## 2016-01-08 DIAGNOSIS — R600 Localized edema: Secondary | ICD-10-CM | POA: Diagnosis not present

## 2016-01-08 DIAGNOSIS — F5081 Binge eating disorder: Secondary | ICD-10-CM | POA: Diagnosis not present

## 2016-02-01 DIAGNOSIS — F5101 Primary insomnia: Secondary | ICD-10-CM | POA: Diagnosis not present

## 2016-02-01 DIAGNOSIS — R51 Headache: Secondary | ICD-10-CM | POA: Diagnosis not present

## 2016-02-01 DIAGNOSIS — E039 Hypothyroidism, unspecified: Secondary | ICD-10-CM | POA: Diagnosis not present

## 2016-02-01 DIAGNOSIS — R945 Abnormal results of liver function studies: Secondary | ICD-10-CM | POA: Diagnosis not present

## 2016-02-01 DIAGNOSIS — E7800 Pure hypercholesterolemia, unspecified: Secondary | ICD-10-CM | POA: Diagnosis not present

## 2016-02-01 DIAGNOSIS — M797 Fibromyalgia: Secondary | ICD-10-CM | POA: Diagnosis not present

## 2016-02-01 DIAGNOSIS — M159 Polyosteoarthritis, unspecified: Secondary | ICD-10-CM | POA: Diagnosis not present

## 2016-02-01 DIAGNOSIS — R600 Localized edema: Secondary | ICD-10-CM | POA: Diagnosis not present

## 2016-02-01 DIAGNOSIS — F5081 Binge eating disorder: Secondary | ICD-10-CM | POA: Diagnosis not present

## 2016-02-01 DIAGNOSIS — S300XXA Contusion of lower back and pelvis, initial encounter: Secondary | ICD-10-CM | POA: Diagnosis not present

## 2016-02-01 DIAGNOSIS — E1165 Type 2 diabetes mellitus with hyperglycemia: Secondary | ICD-10-CM | POA: Diagnosis not present

## 2016-02-01 DIAGNOSIS — E1142 Type 2 diabetes mellitus with diabetic polyneuropathy: Secondary | ICD-10-CM | POA: Diagnosis not present

## 2016-02-03 DIAGNOSIS — M545 Low back pain: Secondary | ICD-10-CM | POA: Diagnosis not present

## 2016-02-03 DIAGNOSIS — S300XXA Contusion of lower back and pelvis, initial encounter: Secondary | ICD-10-CM | POA: Diagnosis not present

## 2016-02-04 DIAGNOSIS — G8929 Other chronic pain: Secondary | ICD-10-CM | POA: Diagnosis not present

## 2016-02-04 DIAGNOSIS — M5442 Lumbago with sciatica, left side: Secondary | ICD-10-CM | POA: Diagnosis not present

## 2016-02-04 DIAGNOSIS — Z79899 Other long term (current) drug therapy: Secondary | ICD-10-CM | POA: Diagnosis not present

## 2016-02-04 DIAGNOSIS — Z9689 Presence of other specified functional implants: Secondary | ICD-10-CM | POA: Diagnosis not present

## 2016-02-04 DIAGNOSIS — Z5181 Encounter for therapeutic drug level monitoring: Secondary | ICD-10-CM | POA: Diagnosis not present

## 2016-02-04 DIAGNOSIS — M5440 Lumbago with sciatica, unspecified side: Secondary | ICD-10-CM | POA: Diagnosis not present

## 2016-02-15 DIAGNOSIS — M159 Polyosteoarthritis, unspecified: Secondary | ICD-10-CM | POA: Diagnosis not present

## 2016-02-15 DIAGNOSIS — E7800 Pure hypercholesterolemia, unspecified: Secondary | ICD-10-CM | POA: Diagnosis not present

## 2016-02-15 DIAGNOSIS — E1165 Type 2 diabetes mellitus with hyperglycemia: Secondary | ICD-10-CM | POA: Diagnosis not present

## 2016-02-15 DIAGNOSIS — F5101 Primary insomnia: Secondary | ICD-10-CM | POA: Diagnosis not present

## 2016-02-15 DIAGNOSIS — E1142 Type 2 diabetes mellitus with diabetic polyneuropathy: Secondary | ICD-10-CM | POA: Diagnosis not present

## 2016-02-15 DIAGNOSIS — F329 Major depressive disorder, single episode, unspecified: Secondary | ICD-10-CM | POA: Diagnosis not present

## 2016-02-15 DIAGNOSIS — R600 Localized edema: Secondary | ICD-10-CM | POA: Diagnosis not present

## 2016-02-15 DIAGNOSIS — R945 Abnormal results of liver function studies: Secondary | ICD-10-CM | POA: Diagnosis not present

## 2016-02-15 DIAGNOSIS — E039 Hypothyroidism, unspecified: Secondary | ICD-10-CM | POA: Diagnosis not present

## 2016-02-15 DIAGNOSIS — M797 Fibromyalgia: Secondary | ICD-10-CM | POA: Diagnosis not present

## 2016-02-15 DIAGNOSIS — F5081 Binge eating disorder: Secondary | ICD-10-CM | POA: Diagnosis not present

## 2016-02-15 DIAGNOSIS — R51 Headache: Secondary | ICD-10-CM | POA: Diagnosis not present

## 2016-03-30 DIAGNOSIS — Z9689 Presence of other specified functional implants: Secondary | ICD-10-CM | POA: Diagnosis not present

## 2016-03-30 DIAGNOSIS — G8929 Other chronic pain: Secondary | ICD-10-CM | POA: Diagnosis not present

## 2016-03-30 DIAGNOSIS — G609 Hereditary and idiopathic neuropathy, unspecified: Secondary | ICD-10-CM | POA: Diagnosis not present

## 2016-03-30 DIAGNOSIS — M5440 Lumbago with sciatica, unspecified side: Secondary | ICD-10-CM | POA: Diagnosis not present

## 2016-03-30 DIAGNOSIS — M5442 Lumbago with sciatica, left side: Secondary | ICD-10-CM | POA: Diagnosis not present

## 2016-05-16 DIAGNOSIS — F5081 Binge eating disorder: Secondary | ICD-10-CM | POA: Diagnosis not present

## 2016-05-16 DIAGNOSIS — F5101 Primary insomnia: Secondary | ICD-10-CM | POA: Diagnosis not present

## 2016-05-16 DIAGNOSIS — R51 Headache: Secondary | ICD-10-CM | POA: Diagnosis not present

## 2016-05-16 DIAGNOSIS — M159 Polyosteoarthritis, unspecified: Secondary | ICD-10-CM | POA: Diagnosis not present

## 2016-05-16 DIAGNOSIS — E1142 Type 2 diabetes mellitus with diabetic polyneuropathy: Secondary | ICD-10-CM | POA: Diagnosis not present

## 2016-05-16 DIAGNOSIS — E1165 Type 2 diabetes mellitus with hyperglycemia: Secondary | ICD-10-CM | POA: Diagnosis not present

## 2016-05-16 DIAGNOSIS — M797 Fibromyalgia: Secondary | ICD-10-CM | POA: Diagnosis not present

## 2016-05-16 DIAGNOSIS — E7800 Pure hypercholesterolemia, unspecified: Secondary | ICD-10-CM | POA: Diagnosis not present

## 2016-05-16 DIAGNOSIS — R600 Localized edema: Secondary | ICD-10-CM | POA: Diagnosis not present

## 2016-05-16 DIAGNOSIS — R945 Abnormal results of liver function studies: Secondary | ICD-10-CM | POA: Diagnosis not present

## 2016-05-16 DIAGNOSIS — E039 Hypothyroidism, unspecified: Secondary | ICD-10-CM | POA: Diagnosis not present

## 2016-05-25 DIAGNOSIS — M5442 Lumbago with sciatica, left side: Secondary | ICD-10-CM | POA: Diagnosis not present

## 2016-05-25 DIAGNOSIS — G8929 Other chronic pain: Secondary | ICD-10-CM | POA: Diagnosis not present

## 2016-05-25 DIAGNOSIS — Z79891 Long term (current) use of opiate analgesic: Secondary | ICD-10-CM | POA: Diagnosis not present

## 2016-05-25 DIAGNOSIS — Z9689 Presence of other specified functional implants: Secondary | ICD-10-CM | POA: Diagnosis not present

## 2016-05-25 DIAGNOSIS — M5440 Lumbago with sciatica, unspecified side: Secondary | ICD-10-CM | POA: Diagnosis not present

## 2016-06-21 DIAGNOSIS — R51 Headache: Secondary | ICD-10-CM | POA: Diagnosis not present

## 2016-06-21 DIAGNOSIS — F5101 Primary insomnia: Secondary | ICD-10-CM | POA: Diagnosis not present

## 2016-06-21 DIAGNOSIS — E1142 Type 2 diabetes mellitus with diabetic polyneuropathy: Secondary | ICD-10-CM | POA: Diagnosis not present

## 2016-06-21 DIAGNOSIS — E039 Hypothyroidism, unspecified: Secondary | ICD-10-CM | POA: Diagnosis not present

## 2016-06-21 DIAGNOSIS — R945 Abnormal results of liver function studies: Secondary | ICD-10-CM | POA: Diagnosis not present

## 2016-06-21 DIAGNOSIS — E7800 Pure hypercholesterolemia, unspecified: Secondary | ICD-10-CM | POA: Diagnosis not present

## 2016-06-21 DIAGNOSIS — E1165 Type 2 diabetes mellitus with hyperglycemia: Secondary | ICD-10-CM | POA: Diagnosis not present

## 2016-06-21 DIAGNOSIS — M159 Polyosteoarthritis, unspecified: Secondary | ICD-10-CM | POA: Diagnosis not present

## 2016-06-21 DIAGNOSIS — F5081 Binge eating disorder: Secondary | ICD-10-CM | POA: Diagnosis not present

## 2016-06-21 DIAGNOSIS — R110 Nausea: Secondary | ICD-10-CM | POA: Diagnosis not present

## 2016-06-21 DIAGNOSIS — R600 Localized edema: Secondary | ICD-10-CM | POA: Diagnosis not present

## 2016-06-21 DIAGNOSIS — M797 Fibromyalgia: Secondary | ICD-10-CM | POA: Diagnosis not present

## 2016-06-27 DIAGNOSIS — E1142 Type 2 diabetes mellitus with diabetic polyneuropathy: Secondary | ICD-10-CM | POA: Diagnosis not present

## 2016-06-27 DIAGNOSIS — E7800 Pure hypercholesterolemia, unspecified: Secondary | ICD-10-CM | POA: Diagnosis not present

## 2016-06-27 DIAGNOSIS — E039 Hypothyroidism, unspecified: Secondary | ICD-10-CM | POA: Diagnosis not present

## 2016-06-27 DIAGNOSIS — M659 Synovitis and tenosynovitis, unspecified: Secondary | ICD-10-CM | POA: Diagnosis not present

## 2016-06-27 DIAGNOSIS — R51 Headache: Secondary | ICD-10-CM | POA: Diagnosis not present

## 2016-06-27 DIAGNOSIS — E1165 Type 2 diabetes mellitus with hyperglycemia: Secondary | ICD-10-CM | POA: Diagnosis not present

## 2016-06-27 DIAGNOSIS — M159 Polyosteoarthritis, unspecified: Secondary | ICD-10-CM | POA: Diagnosis not present

## 2016-06-27 DIAGNOSIS — M797 Fibromyalgia: Secondary | ICD-10-CM | POA: Diagnosis not present

## 2016-06-27 DIAGNOSIS — R945 Abnormal results of liver function studies: Secondary | ICD-10-CM | POA: Diagnosis not present

## 2016-06-27 DIAGNOSIS — R600 Localized edema: Secondary | ICD-10-CM | POA: Diagnosis not present

## 2016-06-27 DIAGNOSIS — F5081 Binge eating disorder: Secondary | ICD-10-CM | POA: Diagnosis not present

## 2016-07-03 DIAGNOSIS — M797 Fibromyalgia: Secondary | ICD-10-CM | POA: Diagnosis not present

## 2016-07-03 DIAGNOSIS — E1165 Type 2 diabetes mellitus with hyperglycemia: Secondary | ICD-10-CM | POA: Diagnosis not present

## 2016-07-03 DIAGNOSIS — F5101 Primary insomnia: Secondary | ICD-10-CM | POA: Diagnosis not present

## 2016-07-03 DIAGNOSIS — R945 Abnormal results of liver function studies: Secondary | ICD-10-CM | POA: Diagnosis not present

## 2016-07-03 DIAGNOSIS — E039 Hypothyroidism, unspecified: Secondary | ICD-10-CM | POA: Diagnosis not present

## 2016-07-03 DIAGNOSIS — R600 Localized edema: Secondary | ICD-10-CM | POA: Diagnosis not present

## 2016-07-03 DIAGNOSIS — E7800 Pure hypercholesterolemia, unspecified: Secondary | ICD-10-CM | POA: Diagnosis not present

## 2016-07-03 DIAGNOSIS — E1142 Type 2 diabetes mellitus with diabetic polyneuropathy: Secondary | ICD-10-CM | POA: Diagnosis not present

## 2016-07-03 DIAGNOSIS — M159 Polyosteoarthritis, unspecified: Secondary | ICD-10-CM | POA: Diagnosis not present

## 2016-07-03 DIAGNOSIS — M659 Synovitis and tenosynovitis, unspecified: Secondary | ICD-10-CM | POA: Diagnosis not present

## 2016-07-03 DIAGNOSIS — R51 Headache: Secondary | ICD-10-CM | POA: Diagnosis not present

## 2016-07-03 DIAGNOSIS — F5081 Binge eating disorder: Secondary | ICD-10-CM | POA: Diagnosis not present

## 2016-07-26 DIAGNOSIS — R600 Localized edema: Secondary | ICD-10-CM | POA: Diagnosis not present

## 2016-07-26 DIAGNOSIS — J208 Acute bronchitis due to other specified organisms: Secondary | ICD-10-CM | POA: Diagnosis not present

## 2016-07-26 DIAGNOSIS — R945 Abnormal results of liver function studies: Secondary | ICD-10-CM | POA: Diagnosis not present

## 2016-07-26 DIAGNOSIS — E7800 Pure hypercholesterolemia, unspecified: Secondary | ICD-10-CM | POA: Diagnosis not present

## 2016-07-26 DIAGNOSIS — E1142 Type 2 diabetes mellitus with diabetic polyneuropathy: Secondary | ICD-10-CM | POA: Diagnosis not present

## 2016-07-26 DIAGNOSIS — R51 Headache: Secondary | ICD-10-CM | POA: Diagnosis not present

## 2016-07-26 DIAGNOSIS — M159 Polyosteoarthritis, unspecified: Secondary | ICD-10-CM | POA: Diagnosis not present

## 2016-07-26 DIAGNOSIS — F5081 Binge eating disorder: Secondary | ICD-10-CM | POA: Diagnosis not present

## 2016-07-26 DIAGNOSIS — M797 Fibromyalgia: Secondary | ICD-10-CM | POA: Diagnosis not present

## 2016-07-26 DIAGNOSIS — I10 Essential (primary) hypertension: Secondary | ICD-10-CM | POA: Diagnosis not present

## 2016-07-26 DIAGNOSIS — E039 Hypothyroidism, unspecified: Secondary | ICD-10-CM | POA: Diagnosis not present

## 2016-07-26 DIAGNOSIS — E1165 Type 2 diabetes mellitus with hyperglycemia: Secondary | ICD-10-CM | POA: Diagnosis not present

## 2016-08-09 DIAGNOSIS — E039 Hypothyroidism, unspecified: Secondary | ICD-10-CM | POA: Diagnosis not present

## 2016-08-09 DIAGNOSIS — E1142 Type 2 diabetes mellitus with diabetic polyneuropathy: Secondary | ICD-10-CM | POA: Diagnosis not present

## 2016-08-09 DIAGNOSIS — R600 Localized edema: Secondary | ICD-10-CM | POA: Diagnosis not present

## 2016-08-09 DIAGNOSIS — M159 Polyosteoarthritis, unspecified: Secondary | ICD-10-CM | POA: Diagnosis not present

## 2016-08-09 DIAGNOSIS — M797 Fibromyalgia: Secondary | ICD-10-CM | POA: Diagnosis not present

## 2016-08-09 DIAGNOSIS — E7800 Pure hypercholesterolemia, unspecified: Secondary | ICD-10-CM | POA: Diagnosis not present

## 2016-08-09 DIAGNOSIS — L0390 Cellulitis, unspecified: Secondary | ICD-10-CM | POA: Diagnosis not present

## 2016-08-09 DIAGNOSIS — E1165 Type 2 diabetes mellitus with hyperglycemia: Secondary | ICD-10-CM | POA: Diagnosis not present

## 2016-08-09 DIAGNOSIS — R51 Headache: Secondary | ICD-10-CM | POA: Diagnosis not present

## 2016-08-09 DIAGNOSIS — R945 Abnormal results of liver function studies: Secondary | ICD-10-CM | POA: Diagnosis not present

## 2016-08-09 DIAGNOSIS — F5081 Binge eating disorder: Secondary | ICD-10-CM | POA: Diagnosis not present

## 2016-08-17 DIAGNOSIS — Z79899 Other long term (current) drug therapy: Secondary | ICD-10-CM | POA: Diagnosis not present

## 2016-08-17 DIAGNOSIS — Z79891 Long term (current) use of opiate analgesic: Secondary | ICD-10-CM | POA: Diagnosis not present

## 2016-08-17 DIAGNOSIS — Z5181 Encounter for therapeutic drug level monitoring: Secondary | ICD-10-CM | POA: Diagnosis not present

## 2016-08-17 DIAGNOSIS — M5440 Lumbago with sciatica, unspecified side: Secondary | ICD-10-CM | POA: Diagnosis not present

## 2016-08-17 DIAGNOSIS — G8929 Other chronic pain: Secondary | ICD-10-CM | POA: Diagnosis not present

## 2016-08-17 DIAGNOSIS — E663 Overweight: Secondary | ICD-10-CM | POA: Diagnosis not present

## 2016-08-17 DIAGNOSIS — M542 Cervicalgia: Secondary | ICD-10-CM | POA: Diagnosis not present

## 2016-08-31 DIAGNOSIS — E1142 Type 2 diabetes mellitus with diabetic polyneuropathy: Secondary | ICD-10-CM | POA: Diagnosis not present

## 2016-08-31 DIAGNOSIS — E1165 Type 2 diabetes mellitus with hyperglycemia: Secondary | ICD-10-CM | POA: Diagnosis not present

## 2016-08-31 DIAGNOSIS — R600 Localized edema: Secondary | ICD-10-CM | POA: Diagnosis not present

## 2016-08-31 DIAGNOSIS — F5081 Binge eating disorder: Secondary | ICD-10-CM | POA: Diagnosis not present

## 2016-08-31 DIAGNOSIS — F329 Major depressive disorder, single episode, unspecified: Secondary | ICD-10-CM | POA: Diagnosis not present

## 2016-08-31 DIAGNOSIS — F5101 Primary insomnia: Secondary | ICD-10-CM | POA: Diagnosis not present

## 2016-08-31 DIAGNOSIS — E039 Hypothyroidism, unspecified: Secondary | ICD-10-CM | POA: Diagnosis not present

## 2016-08-31 DIAGNOSIS — E7800 Pure hypercholesterolemia, unspecified: Secondary | ICD-10-CM | POA: Diagnosis not present

## 2016-08-31 DIAGNOSIS — M797 Fibromyalgia: Secondary | ICD-10-CM | POA: Diagnosis not present

## 2016-08-31 DIAGNOSIS — R945 Abnormal results of liver function studies: Secondary | ICD-10-CM | POA: Diagnosis not present

## 2016-08-31 DIAGNOSIS — M159 Polyosteoarthritis, unspecified: Secondary | ICD-10-CM | POA: Diagnosis not present

## 2016-08-31 DIAGNOSIS — R51 Headache: Secondary | ICD-10-CM | POA: Diagnosis not present

## 2016-10-03 DIAGNOSIS — R079 Chest pain, unspecified: Secondary | ICD-10-CM | POA: Diagnosis not present

## 2016-10-03 DIAGNOSIS — R0789 Other chest pain: Secondary | ICD-10-CM | POA: Diagnosis not present

## 2016-10-04 DIAGNOSIS — F5081 Binge eating disorder: Secondary | ICD-10-CM | POA: Diagnosis not present

## 2016-10-04 DIAGNOSIS — F341 Dysthymic disorder: Secondary | ICD-10-CM | POA: Diagnosis not present

## 2016-10-04 DIAGNOSIS — E7800 Pure hypercholesterolemia, unspecified: Secondary | ICD-10-CM | POA: Diagnosis not present

## 2016-10-04 DIAGNOSIS — R911 Solitary pulmonary nodule: Secondary | ICD-10-CM | POA: Diagnosis not present

## 2016-10-04 DIAGNOSIS — R600 Localized edema: Secondary | ICD-10-CM | POA: Diagnosis not present

## 2016-10-04 DIAGNOSIS — E1165 Type 2 diabetes mellitus with hyperglycemia: Secondary | ICD-10-CM | POA: Diagnosis not present

## 2016-10-04 DIAGNOSIS — F5101 Primary insomnia: Secondary | ICD-10-CM | POA: Diagnosis not present

## 2016-10-04 DIAGNOSIS — E039 Hypothyroidism, unspecified: Secondary | ICD-10-CM | POA: Diagnosis not present

## 2016-10-04 DIAGNOSIS — R51 Headache: Secondary | ICD-10-CM | POA: Diagnosis not present

## 2016-10-04 DIAGNOSIS — R945 Abnormal results of liver function studies: Secondary | ICD-10-CM | POA: Diagnosis not present

## 2016-10-04 DIAGNOSIS — M797 Fibromyalgia: Secondary | ICD-10-CM | POA: Diagnosis not present

## 2016-10-04 DIAGNOSIS — E1142 Type 2 diabetes mellitus with diabetic polyneuropathy: Secondary | ICD-10-CM | POA: Diagnosis not present

## 2016-10-20 DIAGNOSIS — E039 Hypothyroidism, unspecified: Secondary | ICD-10-CM | POA: Diagnosis not present

## 2016-10-20 DIAGNOSIS — F5101 Primary insomnia: Secondary | ICD-10-CM | POA: Diagnosis not present

## 2016-10-20 DIAGNOSIS — R945 Abnormal results of liver function studies: Secondary | ICD-10-CM | POA: Diagnosis not present

## 2016-10-20 DIAGNOSIS — E7800 Pure hypercholesterolemia, unspecified: Secondary | ICD-10-CM | POA: Diagnosis not present

## 2016-10-20 DIAGNOSIS — R51 Headache: Secondary | ICD-10-CM | POA: Diagnosis not present

## 2016-10-20 DIAGNOSIS — E1142 Type 2 diabetes mellitus with diabetic polyneuropathy: Secondary | ICD-10-CM | POA: Diagnosis not present

## 2016-10-20 DIAGNOSIS — M797 Fibromyalgia: Secondary | ICD-10-CM | POA: Diagnosis not present

## 2016-10-20 DIAGNOSIS — F341 Dysthymic disorder: Secondary | ICD-10-CM | POA: Diagnosis not present

## 2016-10-20 DIAGNOSIS — I10 Essential (primary) hypertension: Secondary | ICD-10-CM | POA: Diagnosis not present

## 2016-10-20 DIAGNOSIS — F5081 Binge eating disorder: Secondary | ICD-10-CM | POA: Diagnosis not present

## 2016-10-20 DIAGNOSIS — E1165 Type 2 diabetes mellitus with hyperglycemia: Secondary | ICD-10-CM | POA: Diagnosis not present

## 2016-10-20 DIAGNOSIS — R600 Localized edema: Secondary | ICD-10-CM | POA: Diagnosis not present

## 2016-11-08 DIAGNOSIS — R945 Abnormal results of liver function studies: Secondary | ICD-10-CM | POA: Diagnosis not present

## 2016-11-08 DIAGNOSIS — E1165 Type 2 diabetes mellitus with hyperglycemia: Secondary | ICD-10-CM | POA: Diagnosis not present

## 2016-11-08 DIAGNOSIS — E039 Hypothyroidism, unspecified: Secondary | ICD-10-CM | POA: Diagnosis not present

## 2016-11-08 DIAGNOSIS — E1142 Type 2 diabetes mellitus with diabetic polyneuropathy: Secondary | ICD-10-CM | POA: Diagnosis not present

## 2016-11-08 DIAGNOSIS — F341 Dysthymic disorder: Secondary | ICD-10-CM | POA: Diagnosis not present

## 2016-11-08 DIAGNOSIS — I10 Essential (primary) hypertension: Secondary | ICD-10-CM | POA: Diagnosis not present

## 2016-11-08 DIAGNOSIS — F5101 Primary insomnia: Secondary | ICD-10-CM | POA: Diagnosis not present

## 2016-11-08 DIAGNOSIS — M797 Fibromyalgia: Secondary | ICD-10-CM | POA: Diagnosis not present

## 2016-11-08 DIAGNOSIS — R600 Localized edema: Secondary | ICD-10-CM | POA: Diagnosis not present

## 2016-11-08 DIAGNOSIS — E7800 Pure hypercholesterolemia, unspecified: Secondary | ICD-10-CM | POA: Diagnosis not present

## 2016-11-08 DIAGNOSIS — R51 Headache: Secondary | ICD-10-CM | POA: Diagnosis not present

## 2016-11-08 DIAGNOSIS — F5081 Binge eating disorder: Secondary | ICD-10-CM | POA: Diagnosis not present

## 2016-11-10 DIAGNOSIS — G8929 Other chronic pain: Secondary | ICD-10-CM | POA: Diagnosis not present

## 2016-11-10 DIAGNOSIS — M5440 Lumbago with sciatica, unspecified side: Secondary | ICD-10-CM | POA: Diagnosis not present

## 2016-11-10 DIAGNOSIS — Z79891 Long term (current) use of opiate analgesic: Secondary | ICD-10-CM | POA: Diagnosis not present

## 2016-11-10 DIAGNOSIS — G894 Chronic pain syndrome: Secondary | ICD-10-CM | POA: Diagnosis not present

## 2016-11-10 DIAGNOSIS — M5136 Other intervertebral disc degeneration, lumbar region: Secondary | ICD-10-CM | POA: Diagnosis not present

## 2016-11-15 DIAGNOSIS — E1142 Type 2 diabetes mellitus with diabetic polyneuropathy: Secondary | ICD-10-CM | POA: Diagnosis not present

## 2016-11-15 DIAGNOSIS — R945 Abnormal results of liver function studies: Secondary | ICD-10-CM | POA: Diagnosis not present

## 2016-11-15 DIAGNOSIS — M797 Fibromyalgia: Secondary | ICD-10-CM | POA: Diagnosis not present

## 2016-11-15 DIAGNOSIS — E7800 Pure hypercholesterolemia, unspecified: Secondary | ICD-10-CM | POA: Diagnosis not present

## 2016-11-15 DIAGNOSIS — E1165 Type 2 diabetes mellitus with hyperglycemia: Secondary | ICD-10-CM | POA: Diagnosis not present

## 2016-11-15 DIAGNOSIS — M25511 Pain in right shoulder: Secondary | ICD-10-CM | POA: Diagnosis not present

## 2016-11-15 DIAGNOSIS — R600 Localized edema: Secondary | ICD-10-CM | POA: Diagnosis not present

## 2016-11-15 DIAGNOSIS — R51 Headache: Secondary | ICD-10-CM | POA: Diagnosis not present

## 2016-11-15 DIAGNOSIS — F5101 Primary insomnia: Secondary | ICD-10-CM | POA: Diagnosis not present

## 2016-11-15 DIAGNOSIS — F5081 Binge eating disorder: Secondary | ICD-10-CM | POA: Diagnosis not present

## 2016-11-15 DIAGNOSIS — E039 Hypothyroidism, unspecified: Secondary | ICD-10-CM | POA: Diagnosis not present

## 2017-02-05 DIAGNOSIS — M5440 Lumbago with sciatica, unspecified side: Secondary | ICD-10-CM | POA: Diagnosis not present

## 2017-02-05 DIAGNOSIS — G8929 Other chronic pain: Secondary | ICD-10-CM | POA: Diagnosis not present

## 2017-02-05 DIAGNOSIS — M791 Myalgia: Secondary | ICD-10-CM | POA: Diagnosis not present

## 2017-02-05 DIAGNOSIS — M5136 Other intervertebral disc degeneration, lumbar region: Secondary | ICD-10-CM | POA: Diagnosis not present

## 2017-02-05 DIAGNOSIS — M542 Cervicalgia: Secondary | ICD-10-CM | POA: Diagnosis not present

## 2017-02-23 DIAGNOSIS — M797 Fibromyalgia: Secondary | ICD-10-CM | POA: Diagnosis not present

## 2017-02-23 DIAGNOSIS — R600 Localized edema: Secondary | ICD-10-CM | POA: Diagnosis not present

## 2017-02-23 DIAGNOSIS — E1165 Type 2 diabetes mellitus with hyperglycemia: Secondary | ICD-10-CM | POA: Diagnosis not present

## 2017-02-23 DIAGNOSIS — R51 Headache: Secondary | ICD-10-CM | POA: Diagnosis not present

## 2017-02-23 DIAGNOSIS — R945 Abnormal results of liver function studies: Secondary | ICD-10-CM | POA: Diagnosis not present

## 2017-02-23 DIAGNOSIS — F5081 Binge eating disorder: Secondary | ICD-10-CM | POA: Diagnosis not present

## 2017-02-23 DIAGNOSIS — E039 Hypothyroidism, unspecified: Secondary | ICD-10-CM | POA: Diagnosis not present

## 2017-02-23 DIAGNOSIS — F341 Dysthymic disorder: Secondary | ICD-10-CM | POA: Diagnosis not present

## 2017-02-23 DIAGNOSIS — E7800 Pure hypercholesterolemia, unspecified: Secondary | ICD-10-CM | POA: Diagnosis not present

## 2017-02-23 DIAGNOSIS — E1142 Type 2 diabetes mellitus with diabetic polyneuropathy: Secondary | ICD-10-CM | POA: Diagnosis not present

## 2017-02-23 DIAGNOSIS — F5101 Primary insomnia: Secondary | ICD-10-CM | POA: Diagnosis not present

## 2017-04-30 DIAGNOSIS — M7918 Myalgia, other site: Secondary | ICD-10-CM | POA: Diagnosis not present

## 2017-04-30 DIAGNOSIS — Z79899 Other long term (current) drug therapy: Secondary | ICD-10-CM | POA: Diagnosis not present

## 2017-04-30 DIAGNOSIS — G609 Hereditary and idiopathic neuropathy, unspecified: Secondary | ICD-10-CM | POA: Diagnosis not present

## 2017-04-30 DIAGNOSIS — M5440 Lumbago with sciatica, unspecified side: Secondary | ICD-10-CM | POA: Diagnosis not present

## 2017-04-30 DIAGNOSIS — G8929 Other chronic pain: Secondary | ICD-10-CM | POA: Diagnosis not present

## 2017-04-30 DIAGNOSIS — Z5181 Encounter for therapeutic drug level monitoring: Secondary | ICD-10-CM | POA: Diagnosis not present

## 2017-04-30 DIAGNOSIS — Z9689 Presence of other specified functional implants: Secondary | ICD-10-CM | POA: Diagnosis not present

## 2017-07-26 DIAGNOSIS — Z6841 Body Mass Index (BMI) 40.0 and over, adult: Secondary | ICD-10-CM | POA: Diagnosis not present

## 2017-07-26 DIAGNOSIS — T148XXA Other injury of unspecified body region, initial encounter: Secondary | ICD-10-CM | POA: Diagnosis not present

## 2017-08-10 DIAGNOSIS — G609 Hereditary and idiopathic neuropathy, unspecified: Secondary | ICD-10-CM | POA: Diagnosis not present

## 2017-08-10 DIAGNOSIS — M542 Cervicalgia: Secondary | ICD-10-CM | POA: Diagnosis not present

## 2017-08-10 DIAGNOSIS — M5136 Other intervertebral disc degeneration, lumbar region: Secondary | ICD-10-CM | POA: Diagnosis not present

## 2017-08-10 DIAGNOSIS — G894 Chronic pain syndrome: Secondary | ICD-10-CM | POA: Diagnosis not present

## 2017-09-06 DIAGNOSIS — F341 Dysthymic disorder: Secondary | ICD-10-CM | POA: Diagnosis not present

## 2017-09-06 DIAGNOSIS — F5081 Binge eating disorder: Secondary | ICD-10-CM | POA: Diagnosis not present

## 2017-09-06 DIAGNOSIS — I10 Essential (primary) hypertension: Secondary | ICD-10-CM | POA: Diagnosis not present

## 2017-09-06 DIAGNOSIS — E039 Hypothyroidism, unspecified: Secondary | ICD-10-CM | POA: Diagnosis not present

## 2017-09-06 DIAGNOSIS — R51 Headache: Secondary | ICD-10-CM | POA: Diagnosis not present

## 2017-09-06 DIAGNOSIS — E7800 Pure hypercholesterolemia, unspecified: Secondary | ICD-10-CM | POA: Diagnosis not present

## 2017-09-06 DIAGNOSIS — R945 Abnormal results of liver function studies: Secondary | ICD-10-CM | POA: Diagnosis not present

## 2017-09-06 DIAGNOSIS — R911 Solitary pulmonary nodule: Secondary | ICD-10-CM | POA: Diagnosis not present

## 2017-09-06 DIAGNOSIS — E1142 Type 2 diabetes mellitus with diabetic polyneuropathy: Secondary | ICD-10-CM | POA: Diagnosis not present

## 2017-09-06 DIAGNOSIS — Z23 Encounter for immunization: Secondary | ICD-10-CM | POA: Diagnosis not present

## 2017-09-06 DIAGNOSIS — M159 Polyosteoarthritis, unspecified: Secondary | ICD-10-CM | POA: Diagnosis not present

## 2017-09-06 DIAGNOSIS — E1165 Type 2 diabetes mellitus with hyperglycemia: Secondary | ICD-10-CM | POA: Diagnosis not present

## 2017-09-26 DIAGNOSIS — E7800 Pure hypercholesterolemia, unspecified: Secondary | ICD-10-CM | POA: Diagnosis not present

## 2017-09-26 DIAGNOSIS — M797 Fibromyalgia: Secondary | ICD-10-CM | POA: Diagnosis not present

## 2017-09-26 DIAGNOSIS — R51 Headache: Secondary | ICD-10-CM | POA: Diagnosis not present

## 2017-09-26 DIAGNOSIS — F341 Dysthymic disorder: Secondary | ICD-10-CM | POA: Diagnosis not present

## 2017-09-26 DIAGNOSIS — E039 Hypothyroidism, unspecified: Secondary | ICD-10-CM | POA: Diagnosis not present

## 2017-09-26 DIAGNOSIS — R945 Abnormal results of liver function studies: Secondary | ICD-10-CM | POA: Diagnosis not present

## 2017-09-26 DIAGNOSIS — R911 Solitary pulmonary nodule: Secondary | ICD-10-CM | POA: Diagnosis not present

## 2017-09-26 DIAGNOSIS — M159 Polyosteoarthritis, unspecified: Secondary | ICD-10-CM | POA: Diagnosis not present

## 2017-09-26 DIAGNOSIS — E1142 Type 2 diabetes mellitus with diabetic polyneuropathy: Secondary | ICD-10-CM | POA: Diagnosis not present

## 2017-09-26 DIAGNOSIS — E1165 Type 2 diabetes mellitus with hyperglycemia: Secondary | ICD-10-CM | POA: Diagnosis not present

## 2017-09-26 DIAGNOSIS — L732 Hidradenitis suppurativa: Secondary | ICD-10-CM | POA: Diagnosis not present

## 2017-09-26 DIAGNOSIS — F5081 Binge eating disorder: Secondary | ICD-10-CM | POA: Diagnosis not present

## 2017-09-27 DIAGNOSIS — R918 Other nonspecific abnormal finding of lung field: Secondary | ICD-10-CM | POA: Diagnosis not present

## 2017-10-26 DIAGNOSIS — Z5181 Encounter for therapeutic drug level monitoring: Secondary | ICD-10-CM | POA: Diagnosis not present

## 2017-10-26 DIAGNOSIS — Z79899 Other long term (current) drug therapy: Secondary | ICD-10-CM | POA: Diagnosis not present

## 2017-10-26 DIAGNOSIS — M7918 Myalgia, other site: Secondary | ICD-10-CM | POA: Diagnosis not present

## 2017-10-26 DIAGNOSIS — G894 Chronic pain syndrome: Secondary | ICD-10-CM | POA: Diagnosis not present

## 2017-10-26 DIAGNOSIS — M542 Cervicalgia: Secondary | ICD-10-CM | POA: Diagnosis not present

## 2017-10-26 DIAGNOSIS — M5136 Other intervertebral disc degeneration, lumbar region: Secondary | ICD-10-CM | POA: Diagnosis not present

## 2017-12-01 DIAGNOSIS — Z794 Long term (current) use of insulin: Secondary | ICD-10-CM | POA: Diagnosis not present

## 2017-12-01 DIAGNOSIS — M545 Low back pain: Secondary | ICD-10-CM | POA: Diagnosis not present

## 2017-12-01 DIAGNOSIS — E039 Hypothyroidism, unspecified: Secondary | ICD-10-CM | POA: Diagnosis not present

## 2017-12-01 DIAGNOSIS — I509 Heart failure, unspecified: Secondary | ICD-10-CM | POA: Diagnosis not present

## 2017-12-01 DIAGNOSIS — E119 Type 2 diabetes mellitus without complications: Secondary | ICD-10-CM | POA: Diagnosis not present

## 2017-12-01 DIAGNOSIS — Z7984 Long term (current) use of oral hypoglycemic drugs: Secondary | ICD-10-CM | POA: Diagnosis not present

## 2017-12-01 DIAGNOSIS — M4690 Unspecified inflammatory spondylopathy, site unspecified: Secondary | ICD-10-CM | POA: Diagnosis not present

## 2017-12-01 DIAGNOSIS — Z79891 Long term (current) use of opiate analgesic: Secondary | ICD-10-CM | POA: Diagnosis not present

## 2017-12-01 DIAGNOSIS — M5416 Radiculopathy, lumbar region: Secondary | ICD-10-CM | POA: Diagnosis not present

## 2017-12-01 DIAGNOSIS — Z79899 Other long term (current) drug therapy: Secondary | ICD-10-CM | POA: Diagnosis not present

## 2017-12-17 DIAGNOSIS — F5081 Binge eating disorder: Secondary | ICD-10-CM | POA: Diagnosis not present

## 2017-12-17 DIAGNOSIS — L732 Hidradenitis suppurativa: Secondary | ICD-10-CM | POA: Diagnosis not present

## 2017-12-17 DIAGNOSIS — E1142 Type 2 diabetes mellitus with diabetic polyneuropathy: Secondary | ICD-10-CM | POA: Diagnosis not present

## 2017-12-17 DIAGNOSIS — E1165 Type 2 diabetes mellitus with hyperglycemia: Secondary | ICD-10-CM | POA: Diagnosis not present

## 2018-01-02 DIAGNOSIS — E7800 Pure hypercholesterolemia, unspecified: Secondary | ICD-10-CM | POA: Diagnosis not present

## 2018-01-02 DIAGNOSIS — E1142 Type 2 diabetes mellitus with diabetic polyneuropathy: Secondary | ICD-10-CM | POA: Diagnosis not present

## 2018-01-02 DIAGNOSIS — Z1339 Encounter for screening examination for other mental health and behavioral disorders: Secondary | ICD-10-CM | POA: Diagnosis not present

## 2018-01-02 DIAGNOSIS — F5081 Binge eating disorder: Secondary | ICD-10-CM | POA: Diagnosis not present

## 2018-01-02 DIAGNOSIS — E1165 Type 2 diabetes mellitus with hyperglycemia: Secondary | ICD-10-CM | POA: Diagnosis not present

## 2018-02-01 DIAGNOSIS — M5136 Other intervertebral disc degeneration, lumbar region: Secondary | ICD-10-CM | POA: Diagnosis not present

## 2018-02-01 DIAGNOSIS — M7918 Myalgia, other site: Secondary | ICD-10-CM | POA: Diagnosis not present

## 2018-02-01 DIAGNOSIS — G894 Chronic pain syndrome: Secondary | ICD-10-CM | POA: Diagnosis not present

## 2018-02-01 DIAGNOSIS — M542 Cervicalgia: Secondary | ICD-10-CM | POA: Diagnosis not present

## 2018-04-04 DIAGNOSIS — Z23 Encounter for immunization: Secondary | ICD-10-CM | POA: Diagnosis not present

## 2018-04-04 DIAGNOSIS — F5081 Binge eating disorder: Secondary | ICD-10-CM | POA: Diagnosis not present

## 2018-04-04 DIAGNOSIS — E1142 Type 2 diabetes mellitus with diabetic polyneuropathy: Secondary | ICD-10-CM | POA: Diagnosis not present

## 2018-04-04 DIAGNOSIS — E1165 Type 2 diabetes mellitus with hyperglycemia: Secondary | ICD-10-CM | POA: Diagnosis not present

## 2018-04-04 DIAGNOSIS — E039 Hypothyroidism, unspecified: Secondary | ICD-10-CM | POA: Diagnosis not present

## 2018-04-04 DIAGNOSIS — E7800 Pure hypercholesterolemia, unspecified: Secondary | ICD-10-CM | POA: Diagnosis not present

## 2018-04-04 DIAGNOSIS — R51 Headache: Secondary | ICD-10-CM | POA: Diagnosis not present

## 2018-04-04 DIAGNOSIS — R945 Abnormal results of liver function studies: Secondary | ICD-10-CM | POA: Diagnosis not present

## 2018-04-12 DIAGNOSIS — R911 Solitary pulmonary nodule: Secondary | ICD-10-CM | POA: Diagnosis not present

## 2018-04-16 DIAGNOSIS — E1142 Type 2 diabetes mellitus with diabetic polyneuropathy: Secondary | ICD-10-CM | POA: Diagnosis not present

## 2018-04-16 DIAGNOSIS — R911 Solitary pulmonary nodule: Secondary | ICD-10-CM | POA: Diagnosis not present

## 2018-04-16 DIAGNOSIS — L732 Hidradenitis suppurativa: Secondary | ICD-10-CM | POA: Diagnosis not present

## 2018-04-16 DIAGNOSIS — R51 Headache: Secondary | ICD-10-CM | POA: Diagnosis not present

## 2018-04-16 DIAGNOSIS — R945 Abnormal results of liver function studies: Secondary | ICD-10-CM | POA: Diagnosis not present

## 2018-04-26 DIAGNOSIS — Z79899 Other long term (current) drug therapy: Secondary | ICD-10-CM | POA: Diagnosis not present

## 2018-04-26 DIAGNOSIS — G609 Hereditary and idiopathic neuropathy, unspecified: Secondary | ICD-10-CM | POA: Diagnosis not present

## 2018-04-26 DIAGNOSIS — M542 Cervicalgia: Secondary | ICD-10-CM | POA: Diagnosis not present

## 2018-04-26 DIAGNOSIS — M5442 Lumbago with sciatica, left side: Secondary | ICD-10-CM | POA: Diagnosis not present

## 2018-04-26 DIAGNOSIS — M5136 Other intervertebral disc degeneration, lumbar region: Secondary | ICD-10-CM | POA: Diagnosis not present

## 2018-04-26 DIAGNOSIS — M5441 Lumbago with sciatica, right side: Secondary | ICD-10-CM | POA: Diagnosis not present

## 2018-04-26 DIAGNOSIS — G8929 Other chronic pain: Secondary | ICD-10-CM | POA: Diagnosis not present

## 2018-04-26 DIAGNOSIS — Z5181 Encounter for therapeutic drug level monitoring: Secondary | ICD-10-CM | POA: Diagnosis not present

## 2018-04-29 ENCOUNTER — Institutional Professional Consult (permissible substitution): Payer: Medicare Other | Admitting: Pulmonary Disease

## 2018-05-03 DIAGNOSIS — F5081 Binge eating disorder: Secondary | ICD-10-CM | POA: Diagnosis not present

## 2018-05-03 DIAGNOSIS — J0100 Acute maxillary sinusitis, unspecified: Secondary | ICD-10-CM | POA: Diagnosis not present

## 2018-05-03 DIAGNOSIS — R51 Headache: Secondary | ICD-10-CM | POA: Diagnosis not present

## 2018-05-03 DIAGNOSIS — E1142 Type 2 diabetes mellitus with diabetic polyneuropathy: Secondary | ICD-10-CM | POA: Diagnosis not present

## 2018-05-04 DIAGNOSIS — K219 Gastro-esophageal reflux disease without esophagitis: Secondary | ICD-10-CM | POA: Diagnosis not present

## 2018-05-04 DIAGNOSIS — R51 Headache: Secondary | ICD-10-CM | POA: Diagnosis not present

## 2018-05-04 DIAGNOSIS — Z794 Long term (current) use of insulin: Secondary | ICD-10-CM | POA: Diagnosis not present

## 2018-05-04 DIAGNOSIS — I509 Heart failure, unspecified: Secondary | ICD-10-CM | POA: Diagnosis not present

## 2018-05-04 DIAGNOSIS — Z79899 Other long term (current) drug therapy: Secondary | ICD-10-CM | POA: Diagnosis not present

## 2018-05-04 DIAGNOSIS — H9392 Unspecified disorder of left ear: Secondary | ICD-10-CM | POA: Diagnosis not present

## 2018-05-04 DIAGNOSIS — H838X2 Other specified diseases of left inner ear: Secondary | ICD-10-CM | POA: Diagnosis not present

## 2018-05-04 DIAGNOSIS — Z79891 Long term (current) use of opiate analgesic: Secondary | ICD-10-CM | POA: Diagnosis not present

## 2018-05-04 DIAGNOSIS — Z7984 Long term (current) use of oral hypoglycemic drugs: Secondary | ICD-10-CM | POA: Diagnosis not present

## 2018-05-04 DIAGNOSIS — R070 Pain in throat: Secondary | ICD-10-CM | POA: Diagnosis not present

## 2018-05-04 DIAGNOSIS — E039 Hypothyroidism, unspecified: Secondary | ICD-10-CM | POA: Diagnosis not present

## 2018-05-04 DIAGNOSIS — E119 Type 2 diabetes mellitus without complications: Secondary | ICD-10-CM | POA: Diagnosis not present

## 2018-05-17 DIAGNOSIS — E1142 Type 2 diabetes mellitus with diabetic polyneuropathy: Secondary | ICD-10-CM | POA: Diagnosis not present

## 2018-05-17 DIAGNOSIS — R945 Abnormal results of liver function studies: Secondary | ICD-10-CM | POA: Diagnosis not present

## 2018-05-17 DIAGNOSIS — B373 Candidiasis of vulva and vagina: Secondary | ICD-10-CM | POA: Diagnosis not present

## 2018-05-17 DIAGNOSIS — R51 Headache: Secondary | ICD-10-CM | POA: Diagnosis not present

## 2018-05-17 DIAGNOSIS — J0100 Acute maxillary sinusitis, unspecified: Secondary | ICD-10-CM | POA: Diagnosis not present

## 2018-05-28 DIAGNOSIS — E1142 Type 2 diabetes mellitus with diabetic polyneuropathy: Secondary | ICD-10-CM | POA: Diagnosis not present

## 2018-05-28 DIAGNOSIS — I10 Essential (primary) hypertension: Secondary | ICD-10-CM | POA: Diagnosis not present

## 2018-05-28 DIAGNOSIS — R945 Abnormal results of liver function studies: Secondary | ICD-10-CM | POA: Diagnosis not present

## 2018-05-28 DIAGNOSIS — J0100 Acute maxillary sinusitis, unspecified: Secondary | ICD-10-CM | POA: Diagnosis not present

## 2018-05-28 DIAGNOSIS — R51 Headache: Secondary | ICD-10-CM | POA: Diagnosis not present

## 2018-07-19 DIAGNOSIS — M5442 Lumbago with sciatica, left side: Secondary | ICD-10-CM | POA: Diagnosis not present

## 2018-07-19 DIAGNOSIS — Z79891 Long term (current) use of opiate analgesic: Secondary | ICD-10-CM | POA: Diagnosis not present

## 2018-07-19 DIAGNOSIS — M542 Cervicalgia: Secondary | ICD-10-CM | POA: Diagnosis not present

## 2018-07-19 DIAGNOSIS — M533 Sacrococcygeal disorders, not elsewhere classified: Secondary | ICD-10-CM | POA: Diagnosis not present

## 2018-07-19 DIAGNOSIS — Z79899 Other long term (current) drug therapy: Secondary | ICD-10-CM | POA: Diagnosis not present

## 2018-07-19 DIAGNOSIS — Z5181 Encounter for therapeutic drug level monitoring: Secondary | ICD-10-CM | POA: Diagnosis not present

## 2018-07-19 DIAGNOSIS — M5440 Lumbago with sciatica, unspecified side: Secondary | ICD-10-CM | POA: Diagnosis not present

## 2018-08-07 ENCOUNTER — Ambulatory Visit (INDEPENDENT_AMBULATORY_CARE_PROVIDER_SITE_OTHER): Payer: Medicare Other | Admitting: Pulmonary Disease

## 2018-08-07 ENCOUNTER — Encounter: Payer: Self-pay | Admitting: Pulmonary Disease

## 2018-08-07 DIAGNOSIS — R911 Solitary pulmonary nodule: Secondary | ICD-10-CM | POA: Diagnosis not present

## 2018-08-07 NOTE — Progress Notes (Signed)
Rod Mae. Mccartha    [id number]    05/26/1976  Primary Care Physician:Lee, Marquette Saa, MD  Referring Physician: Simone Curia, MD [address] [place], [place] [address]  Chief complaint: Consult for lung nodules  HPI: 43 year old with history of diabetes, obesity, hypothyroidism.  Here for evaluation of lung nodule She had the first CT scan in 2018 in the ED for evaluation of chest pain.  She had a left upper lobe nodule with groundglass opacities Follow-up CT in 2019 shows resolution of the left nodule.  There is a small right subpleural nodule  She does not have any respiratory symptoms.  No cough, sputum production, dyspnea, wheezing  Pets: Has cats and dogs.  No birds, farm animals Occupation: She used to work as Surveyor, mining.  Currently on disability Exposures: No known exposures, no dampness, mold, hot tub, Jacuzzi, humidifier Smoking history: Never smoker Travel history: Lived in [place] previously.  No significant recent travel Relevant family history: Father died of COPD, asthma [he was a smoker]  Outpatient Encounter Medications as of 08/07/2018  Medication Sig  . aspirin 81 MG tablet Take 81 mg by mouth daily.  . DULoxetine (CYMBALTA) 60 MG capsule Take 60 mg by mouth 2 (two) times daily.  . furosemide (LASIX) 20 MG tablet Take 20 mg by mouth daily.  . Gabapentin, PHN, (GRALISE) 600 MG TABS Take 3,000 mg by mouth at bedtime.  . insulin glargine (LANTUS) 100 UNIT/ML injection Inject 70 Units into the skin at bedtime.  Marland Kitchen levothyroxine (LEVOTHROID) 200 MCG tablet Take 300 mcg by mouth daily before breakfast. Pt takes 250 mcg daily. Pt takes with 50 mcg   . Multiple Vitamins-Minerals (MULTIVITAMIN WITH MINERALS) tablet Take 1 tablet by mouth daily.  . nortriptyline (PAMELOR) 25 MG capsule Take 25 mg by mouth See admin instructions. Pt takes with 50 mg for a total dose of 75 mg  . nortriptyline (PAMELOR) 50 MG capsule Take 50 mg by mouth See admin  instructions. Pt takes with 25 mg for a total dose of 75 mg  . oxyCODONE-acetaminophen (PERCOCET) 10-325 MG tablet Take 1 tablet by mouth every 4 (four) hours as needed for pain.  . celecoxib (CELEBREX) 100 MG capsule Take 100 mg by mouth 2 (two) times daily.  Marland Kitchen levothyroxine (SYNTHROID, LEVOTHROID) 50 MCG tablet Take 50 mcg by mouth daily before breakfast. Pt takes with 200 mcg for a total dose of 250 mcg  . nabumetone (RELAFEN) 500 MG tablet Take 500 mg by mouth 2 (two) times daily.  . sennosides-docusate sodium (SENOKOT-S) 8.6-50 MG tablet Take 2 tablets by mouth 3 (three) times daily.  . Tapentadol HCl (NUCYNTA) 100 MG TABS Take 100 mg by mouth 2 (two) times daily.   No facility-administered encounter medications on file as of 08/07/2018.     Allergies as of 08/07/2018  . (No Known Allergies)    Past Medical History:  Diagnosis Date  . Diabetes mellitus without complication [facility])     Past Surgical History:  Procedure Laterality Date  . CARPAL TUNNEL RELEASE    . CHOLECYSTECTOMY    . TUBAL LIGATION      History reviewed. No pertinent family history.  Social History   Socioeconomic History  . Marital status: Legally Separated    Spouse name: Not on file  . Number of children: Not on file  . Years of education: Not on file  . Highest education level: Not on file  Occupational  History  . Not on file  Social Needs  . Financial resource strain: Not on file  . Food insecurity:    Worry: Not on file    Inability: Not on file  . Transportation needs:    Medical: Not on file    Non-medical: Not on file  Tobacco Use  . Smoking status: Never Smoker  . Smokeless tobacco: Never Used  Substance and Sexual Activity  . Alcohol use: No  . Drug use: No  . Sexual activity: Not on file  Lifestyle  . Physical activity:    Days per week: Not on file    Minutes per session: Not on file  . Stress: Not on file  Relationships  . Social connections:    Talks on phone: Not on file      Gets together: Not on file    Attends religious service: Not on file    Active member of club or organization: Not on file    Attends meetings of clubs or organizations: Not on file    Relationship status: Not on file  . Intimate partner violence:    Fear of current or ex partner: Not on file    Emotionally abused: Not on file    Physically abused: Not on file    Forced sexual activity: Not on file  Other Topics Concern  . Not on file  Social History Narrative  . Not on file    Review of systems: Review of Systems  Constitutional: Negative for fever and chills.  HENT: Negative.   Eyes: Negative for blurred vision.  Respiratory: as per HPI  Cardiovascular: Negative for chest pain and palpitations.  Gastrointestinal: Negative for vomiting, diarrhea, blood per rectum. Genitourinary: Negative for dysuria, urgency, frequency and hematuria.  Musculoskeletal: Negative for myalgias, back pain and joint pain.  Skin: Negative for itching and rash.  Neurological: Negative for dizziness, tremors, focal weakness, seizures and loss of consciousness.  Endo/Heme/Allergies: Negative for environmental allergies.  Psychiatric/Behavioral: Negative for depression, suicidal ideas and hallucinations.  All other systems reviewed and are negative.  Physical Exam: Blood pressure 126/74, pulse (!) 103, height 5\' 6"  (1.676 m), weight (!) 367 lb 9.6 oz (166.7 kg), SpO2 97 %. Gen:      No acute distress HEENT:  EOMI, sclera anicteric Neck:     No masses; no thyromegaly Lungs:    Clear to auscultation bilaterally; normal respiratory effort CV:         Regular rate and rhythm; no murmurs Abd:      + bowel sounds; soft, non-tender; no palpable masses, no distension Ext:    No edema; adequate peripheral perfusion Skin:      Warm and dry; no rash Neuro: alert and oriented x 3 Psych: normal mood and affect  Data Reviewed: Imaging: CT chest 10/03/2016- 7 mm nodule in the left upper lobe with surrounding  groundglass changes.  CT chest 04/12/2018-  previously identified left upper lobe nodule has resolved.  New right upper lobe subpleural nodule measuring 7.5 cm. Reviewed the images personally.  Assessment:  Lung nodules CT scan shows subcentimeter pulmonary nodule which are nonspecific, inflammatory nature No interventions needed at present. We will follow-up with a CT scan in 6 months time  Plan/Recommendations: CT scan in 6 months.  Chilton Greathouse MD [facility] 08/07/2018, 3:30 PM  CC: Simone Curia, MD

## 2018-08-07 NOTE — Patient Instructions (Signed)
We will get a CT chest without contrast in 6 months for lung nodule follow-up Follow-up in clinic after CT scan.

## 2018-10-11 DIAGNOSIS — M5440 Lumbago with sciatica, unspecified side: Secondary | ICD-10-CM | POA: Diagnosis not present

## 2018-10-11 DIAGNOSIS — M7918 Myalgia, other site: Secondary | ICD-10-CM | POA: Diagnosis not present

## 2018-10-11 DIAGNOSIS — G8929 Other chronic pain: Secondary | ICD-10-CM | POA: Diagnosis not present

## 2018-10-11 DIAGNOSIS — M5136 Other intervertebral disc degeneration, lumbar region: Secondary | ICD-10-CM | POA: Diagnosis not present

## 2018-10-11 DIAGNOSIS — M5442 Lumbago with sciatica, left side: Secondary | ICD-10-CM | POA: Diagnosis not present

## 2018-10-11 DIAGNOSIS — M5441 Lumbago with sciatica, right side: Secondary | ICD-10-CM | POA: Diagnosis not present

## 2018-11-06 DIAGNOSIS — R945 Abnormal results of liver function studies: Secondary | ICD-10-CM | POA: Diagnosis not present

## 2018-11-06 DIAGNOSIS — R51 Headache: Secondary | ICD-10-CM | POA: Diagnosis not present

## 2018-11-06 DIAGNOSIS — I10 Essential (primary) hypertension: Secondary | ICD-10-CM | POA: Diagnosis not present

## 2018-11-06 DIAGNOSIS — F3341 Major depressive disorder, recurrent, in partial remission: Secondary | ICD-10-CM | POA: Diagnosis not present

## 2018-12-27 DIAGNOSIS — F3341 Major depressive disorder, recurrent, in partial remission: Secondary | ICD-10-CM | POA: Diagnosis not present

## 2018-12-27 DIAGNOSIS — R51 Headache: Secondary | ICD-10-CM | POA: Diagnosis not present

## 2018-12-27 DIAGNOSIS — R945 Abnormal results of liver function studies: Secondary | ICD-10-CM | POA: Diagnosis not present

## 2018-12-27 DIAGNOSIS — E118 Type 2 diabetes mellitus with unspecified complications: Secondary | ICD-10-CM | POA: Diagnosis not present

## 2018-12-27 DIAGNOSIS — I10 Essential (primary) hypertension: Secondary | ICD-10-CM | POA: Diagnosis not present

## 2018-12-27 DIAGNOSIS — E039 Hypothyroidism, unspecified: Secondary | ICD-10-CM | POA: Diagnosis not present

## 2018-12-27 DIAGNOSIS — E7800 Pure hypercholesterolemia, unspecified: Secondary | ICD-10-CM | POA: Diagnosis not present

## 2019-01-03 DIAGNOSIS — Z9689 Presence of other specified functional implants: Secondary | ICD-10-CM | POA: Diagnosis not present

## 2019-01-03 DIAGNOSIS — E663 Overweight: Secondary | ICD-10-CM | POA: Diagnosis not present

## 2019-01-03 DIAGNOSIS — M5442 Lumbago with sciatica, left side: Secondary | ICD-10-CM | POA: Diagnosis not present

## 2019-01-03 DIAGNOSIS — M5136 Other intervertebral disc degeneration, lumbar region: Secondary | ICD-10-CM | POA: Diagnosis not present

## 2019-01-14 DIAGNOSIS — M5441 Lumbago with sciatica, right side: Secondary | ICD-10-CM | POA: Diagnosis not present

## 2019-01-14 DIAGNOSIS — G8929 Other chronic pain: Secondary | ICD-10-CM | POA: Diagnosis not present

## 2019-01-14 DIAGNOSIS — M47816 Spondylosis without myelopathy or radiculopathy, lumbar region: Secondary | ICD-10-CM | POA: Diagnosis not present

## 2019-01-14 DIAGNOSIS — M5442 Lumbago with sciatica, left side: Secondary | ICD-10-CM | POA: Diagnosis not present

## 2019-01-20 DIAGNOSIS — E118 Type 2 diabetes mellitus with unspecified complications: Secondary | ICD-10-CM | POA: Diagnosis not present

## 2019-01-20 DIAGNOSIS — R945 Abnormal results of liver function studies: Secondary | ICD-10-CM | POA: Diagnosis not present

## 2019-01-20 DIAGNOSIS — I10 Essential (primary) hypertension: Secondary | ICD-10-CM | POA: Diagnosis not present

## 2019-01-20 DIAGNOSIS — R51 Headache: Secondary | ICD-10-CM | POA: Diagnosis not present

## 2019-01-27 DIAGNOSIS — E118 Type 2 diabetes mellitus with unspecified complications: Secondary | ICD-10-CM | POA: Diagnosis not present

## 2019-01-27 DIAGNOSIS — R51 Headache: Secondary | ICD-10-CM | POA: Diagnosis not present

## 2019-01-27 DIAGNOSIS — I10 Essential (primary) hypertension: Secondary | ICD-10-CM | POA: Diagnosis not present

## 2019-01-27 DIAGNOSIS — R945 Abnormal results of liver function studies: Secondary | ICD-10-CM | POA: Diagnosis not present

## 2019-02-03 DIAGNOSIS — Z5181 Encounter for therapeutic drug level monitoring: Secondary | ICD-10-CM | POA: Diagnosis not present

## 2019-02-03 DIAGNOSIS — M7918 Myalgia, other site: Secondary | ICD-10-CM | POA: Diagnosis not present

## 2019-02-03 DIAGNOSIS — Z9689 Presence of other specified functional implants: Secondary | ICD-10-CM | POA: Diagnosis not present

## 2019-02-03 DIAGNOSIS — Z79899 Other long term (current) drug therapy: Secondary | ICD-10-CM | POA: Diagnosis not present

## 2019-02-03 DIAGNOSIS — G609 Hereditary and idiopathic neuropathy, unspecified: Secondary | ICD-10-CM | POA: Diagnosis not present

## 2019-02-03 DIAGNOSIS — M5136 Other intervertebral disc degeneration, lumbar region: Secondary | ICD-10-CM | POA: Diagnosis not present

## 2019-02-06 DIAGNOSIS — H6692 Otitis media, unspecified, left ear: Secondary | ICD-10-CM | POA: Diagnosis not present

## 2019-02-06 DIAGNOSIS — R945 Abnormal results of liver function studies: Secondary | ICD-10-CM | POA: Diagnosis not present

## 2019-02-06 DIAGNOSIS — R51 Headache: Secondary | ICD-10-CM | POA: Diagnosis not present

## 2019-02-06 DIAGNOSIS — I10 Essential (primary) hypertension: Secondary | ICD-10-CM | POA: Diagnosis not present

## 2019-02-10 ENCOUNTER — Ambulatory Visit (HOSPITAL_COMMUNITY): Admission: RE | Admit: 2019-02-10 | Payer: Medicare Other | Source: Ambulatory Visit

## 2019-02-10 DIAGNOSIS — H6692 Otitis media, unspecified, left ear: Secondary | ICD-10-CM | POA: Diagnosis not present

## 2019-02-10 DIAGNOSIS — R945 Abnormal results of liver function studies: Secondary | ICD-10-CM | POA: Diagnosis not present

## 2019-02-10 DIAGNOSIS — R51 Headache: Secondary | ICD-10-CM | POA: Diagnosis not present

## 2019-02-10 DIAGNOSIS — I10 Essential (primary) hypertension: Secondary | ICD-10-CM | POA: Diagnosis not present

## 2019-02-24 DIAGNOSIS — E118 Type 2 diabetes mellitus with unspecified complications: Secondary | ICD-10-CM | POA: Diagnosis not present

## 2019-02-24 DIAGNOSIS — R51 Headache: Secondary | ICD-10-CM | POA: Diagnosis not present

## 2019-02-24 DIAGNOSIS — R945 Abnormal results of liver function studies: Secondary | ICD-10-CM | POA: Diagnosis not present

## 2019-02-24 DIAGNOSIS — I10 Essential (primary) hypertension: Secondary | ICD-10-CM | POA: Diagnosis not present

## 2019-02-28 DIAGNOSIS — E118 Type 2 diabetes mellitus with unspecified complications: Secondary | ICD-10-CM | POA: Diagnosis not present

## 2019-02-28 DIAGNOSIS — R51 Headache: Secondary | ICD-10-CM | POA: Diagnosis not present

## 2019-02-28 DIAGNOSIS — H6692 Otitis media, unspecified, left ear: Secondary | ICD-10-CM | POA: Diagnosis not present

## 2019-02-28 DIAGNOSIS — R945 Abnormal results of liver function studies: Secondary | ICD-10-CM | POA: Diagnosis not present

## 2019-03-07 DIAGNOSIS — E669 Obesity, unspecified: Secondary | ICD-10-CM | POA: Diagnosis not present

## 2019-03-07 DIAGNOSIS — Z794 Long term (current) use of insulin: Secondary | ICD-10-CM | POA: Diagnosis not present

## 2019-03-07 DIAGNOSIS — F329 Major depressive disorder, single episode, unspecified: Secondary | ICD-10-CM | POA: Diagnosis not present

## 2019-03-07 DIAGNOSIS — R51 Headache: Secondary | ICD-10-CM | POA: Diagnosis not present

## 2019-03-07 DIAGNOSIS — F419 Anxiety disorder, unspecified: Secondary | ICD-10-CM | POA: Diagnosis not present

## 2019-03-07 DIAGNOSIS — I10 Essential (primary) hypertension: Secondary | ICD-10-CM | POA: Diagnosis not present

## 2019-03-07 DIAGNOSIS — M533 Sacrococcygeal disorders, not elsewhere classified: Secondary | ICD-10-CM | POA: Diagnosis not present

## 2019-03-07 DIAGNOSIS — M7918 Myalgia, other site: Secondary | ICD-10-CM | POA: Diagnosis not present

## 2019-03-07 DIAGNOSIS — F4310 Post-traumatic stress disorder, unspecified: Secondary | ICD-10-CM | POA: Diagnosis not present

## 2019-03-07 DIAGNOSIS — M545 Low back pain: Secondary | ICD-10-CM | POA: Diagnosis not present

## 2019-03-07 DIAGNOSIS — E039 Hypothyroidism, unspecified: Secondary | ICD-10-CM | POA: Diagnosis not present

## 2019-03-07 DIAGNOSIS — E118 Type 2 diabetes mellitus with unspecified complications: Secondary | ICD-10-CM | POA: Diagnosis not present

## 2019-03-07 DIAGNOSIS — Z79899 Other long term (current) drug therapy: Secondary | ICD-10-CM | POA: Diagnosis not present

## 2019-03-07 DIAGNOSIS — Z6841 Body Mass Index (BMI) 40.0 and over, adult: Secondary | ICD-10-CM | POA: Diagnosis not present

## 2019-03-07 DIAGNOSIS — E119 Type 2 diabetes mellitus without complications: Secondary | ICD-10-CM | POA: Diagnosis not present

## 2019-03-07 DIAGNOSIS — R945 Abnormal results of liver function studies: Secondary | ICD-10-CM | POA: Diagnosis not present

## 2019-03-12 DIAGNOSIS — H938X2 Other specified disorders of left ear: Secondary | ICD-10-CM | POA: Diagnosis not present

## 2019-03-12 DIAGNOSIS — E669 Obesity, unspecified: Secondary | ICD-10-CM | POA: Diagnosis not present

## 2019-03-12 DIAGNOSIS — H9202 Otalgia, left ear: Secondary | ICD-10-CM | POA: Diagnosis not present

## 2019-03-12 DIAGNOSIS — H73002 Acute myringitis, left ear: Secondary | ICD-10-CM | POA: Diagnosis not present

## 2019-03-12 DIAGNOSIS — Z8669 Personal history of other diseases of the nervous system and sense organs: Secondary | ICD-10-CM | POA: Diagnosis not present

## 2019-03-17 ENCOUNTER — Inpatient Hospital Stay (HOSPITAL_COMMUNITY): Admit: 2019-03-17 | Payer: Self-pay

## 2019-03-27 DIAGNOSIS — R945 Abnormal results of liver function studies: Secondary | ICD-10-CM | POA: Diagnosis not present

## 2019-03-27 DIAGNOSIS — R51 Headache: Secondary | ICD-10-CM | POA: Diagnosis not present

## 2019-03-27 DIAGNOSIS — E118 Type 2 diabetes mellitus with unspecified complications: Secondary | ICD-10-CM | POA: Diagnosis not present

## 2019-03-27 DIAGNOSIS — I10 Essential (primary) hypertension: Secondary | ICD-10-CM | POA: Diagnosis not present

## 2019-03-31 DIAGNOSIS — E7800 Pure hypercholesterolemia, unspecified: Secondary | ICD-10-CM | POA: Diagnosis not present

## 2019-03-31 DIAGNOSIS — E118 Type 2 diabetes mellitus with unspecified complications: Secondary | ICD-10-CM | POA: Diagnosis not present

## 2019-03-31 DIAGNOSIS — H9203 Otalgia, bilateral: Secondary | ICD-10-CM | POA: Diagnosis not present

## 2019-03-31 DIAGNOSIS — M26623 Arthralgia of bilateral temporomandibular joint: Secondary | ICD-10-CM | POA: Diagnosis not present

## 2019-04-02 DIAGNOSIS — M545 Low back pain: Secondary | ICD-10-CM | POA: Diagnosis not present

## 2019-04-02 DIAGNOSIS — Z79899 Other long term (current) drug therapy: Secondary | ICD-10-CM | POA: Diagnosis not present

## 2019-04-02 DIAGNOSIS — E039 Hypothyroidism, unspecified: Secondary | ICD-10-CM | POA: Diagnosis not present

## 2019-04-02 DIAGNOSIS — Z6841 Body Mass Index (BMI) 40.0 and over, adult: Secondary | ICD-10-CM | POA: Diagnosis not present

## 2019-04-02 DIAGNOSIS — M533 Sacrococcygeal disorders, not elsewhere classified: Secondary | ICD-10-CM | POA: Diagnosis not present

## 2019-04-02 DIAGNOSIS — G894 Chronic pain syndrome: Secondary | ICD-10-CM | POA: Diagnosis not present

## 2019-04-02 DIAGNOSIS — E669 Obesity, unspecified: Secondary | ICD-10-CM | POA: Diagnosis not present

## 2019-04-02 DIAGNOSIS — E119 Type 2 diabetes mellitus without complications: Secondary | ICD-10-CM | POA: Diagnosis not present

## 2019-04-02 DIAGNOSIS — Z7984 Long term (current) use of oral hypoglycemic drugs: Secondary | ICD-10-CM | POA: Diagnosis not present

## 2019-04-02 DIAGNOSIS — M7918 Myalgia, other site: Secondary | ICD-10-CM | POA: Diagnosis not present

## 2019-04-02 DIAGNOSIS — F4312 Post-traumatic stress disorder, chronic: Secondary | ICD-10-CM | POA: Diagnosis not present

## 2019-04-02 DIAGNOSIS — F329 Major depressive disorder, single episode, unspecified: Secondary | ICD-10-CM | POA: Diagnosis not present

## 2019-04-02 DIAGNOSIS — Z794 Long term (current) use of insulin: Secondary | ICD-10-CM | POA: Diagnosis not present

## 2019-04-02 DIAGNOSIS — F419 Anxiety disorder, unspecified: Secondary | ICD-10-CM | POA: Diagnosis not present

## 2019-04-18 DIAGNOSIS — Z0000 Encounter for general adult medical examination without abnormal findings: Secondary | ICD-10-CM | POA: Diagnosis not present

## 2019-04-18 DIAGNOSIS — Z9181 History of falling: Secondary | ICD-10-CM | POA: Diagnosis not present

## 2019-04-18 DIAGNOSIS — Z6841 Body Mass Index (BMI) 40.0 and over, adult: Secondary | ICD-10-CM | POA: Diagnosis not present

## 2019-04-18 DIAGNOSIS — E785 Hyperlipidemia, unspecified: Secondary | ICD-10-CM | POA: Diagnosis not present

## 2019-04-18 DIAGNOSIS — Z1331 Encounter for screening for depression: Secondary | ICD-10-CM | POA: Diagnosis not present

## 2019-04-18 DIAGNOSIS — Z1231 Encounter for screening mammogram for malignant neoplasm of breast: Secondary | ICD-10-CM | POA: Diagnosis not present

## 2019-04-24 DIAGNOSIS — I10 Essential (primary) hypertension: Secondary | ICD-10-CM | POA: Diagnosis not present

## 2019-04-24 DIAGNOSIS — F3341 Major depressive disorder, recurrent, in partial remission: Secondary | ICD-10-CM | POA: Diagnosis not present

## 2019-04-24 DIAGNOSIS — R519 Headache, unspecified: Secondary | ICD-10-CM | POA: Diagnosis not present

## 2019-04-24 DIAGNOSIS — R945 Abnormal results of liver function studies: Secondary | ICD-10-CM | POA: Diagnosis not present

## 2019-04-25 DIAGNOSIS — M5136 Other intervertebral disc degeneration, lumbar region: Secondary | ICD-10-CM | POA: Diagnosis not present

## 2019-04-25 DIAGNOSIS — G894 Chronic pain syndrome: Secondary | ICD-10-CM | POA: Diagnosis not present

## 2019-04-25 DIAGNOSIS — M542 Cervicalgia: Secondary | ICD-10-CM | POA: Diagnosis not present

## 2019-04-25 DIAGNOSIS — G609 Hereditary and idiopathic neuropathy, unspecified: Secondary | ICD-10-CM | POA: Diagnosis not present

## 2019-05-03 DIAGNOSIS — Z23 Encounter for immunization: Secondary | ICD-10-CM | POA: Diagnosis not present

## 2019-05-22 DIAGNOSIS — R945 Abnormal results of liver function studies: Secondary | ICD-10-CM | POA: Diagnosis not present

## 2019-05-22 DIAGNOSIS — F3341 Major depressive disorder, recurrent, in partial remission: Secondary | ICD-10-CM | POA: Diagnosis not present

## 2019-05-22 DIAGNOSIS — R519 Headache, unspecified: Secondary | ICD-10-CM | POA: Diagnosis not present

## 2019-05-22 DIAGNOSIS — L732 Hidradenitis suppurativa: Secondary | ICD-10-CM | POA: Diagnosis not present

## 2019-06-05 DIAGNOSIS — F3341 Major depressive disorder, recurrent, in partial remission: Secondary | ICD-10-CM | POA: Diagnosis not present

## 2019-06-05 DIAGNOSIS — R519 Headache, unspecified: Secondary | ICD-10-CM | POA: Diagnosis not present

## 2019-06-05 DIAGNOSIS — L722 Steatocystoma multiplex: Secondary | ICD-10-CM | POA: Diagnosis not present

## 2019-06-05 DIAGNOSIS — R945 Abnormal results of liver function studies: Secondary | ICD-10-CM | POA: Diagnosis not present

## 2019-06-19 DIAGNOSIS — R945 Abnormal results of liver function studies: Secondary | ICD-10-CM | POA: Diagnosis not present

## 2019-06-19 DIAGNOSIS — F5101 Primary insomnia: Secondary | ICD-10-CM | POA: Diagnosis not present

## 2019-06-19 DIAGNOSIS — F3341 Major depressive disorder, recurrent, in partial remission: Secondary | ICD-10-CM | POA: Diagnosis not present

## 2019-06-19 DIAGNOSIS — L732 Hidradenitis suppurativa: Secondary | ICD-10-CM | POA: Diagnosis not present

## 2019-06-20 DIAGNOSIS — Z9689 Presence of other specified functional implants: Secondary | ICD-10-CM | POA: Diagnosis not present

## 2019-06-20 DIAGNOSIS — M5136 Other intervertebral disc degeneration, lumbar region: Secondary | ICD-10-CM | POA: Diagnosis not present

## 2019-06-20 DIAGNOSIS — G894 Chronic pain syndrome: Secondary | ICD-10-CM | POA: Diagnosis not present

## 2019-06-20 DIAGNOSIS — M542 Cervicalgia: Secondary | ICD-10-CM | POA: Diagnosis not present

## 2019-07-28 DIAGNOSIS — L0390 Cellulitis, unspecified: Secondary | ICD-10-CM | POA: Diagnosis not present

## 2019-07-28 DIAGNOSIS — R945 Abnormal results of liver function studies: Secondary | ICD-10-CM | POA: Diagnosis not present

## 2019-07-28 DIAGNOSIS — F3341 Major depressive disorder, recurrent, in partial remission: Secondary | ICD-10-CM | POA: Diagnosis not present

## 2019-07-28 DIAGNOSIS — L732 Hidradenitis suppurativa: Secondary | ICD-10-CM | POA: Diagnosis not present

## 2019-08-15 DIAGNOSIS — F3341 Major depressive disorder, recurrent, in partial remission: Secondary | ICD-10-CM | POA: Diagnosis not present

## 2019-08-15 DIAGNOSIS — F5101 Primary insomnia: Secondary | ICD-10-CM | POA: Diagnosis not present

## 2019-08-15 DIAGNOSIS — F5081 Binge eating disorder: Secondary | ICD-10-CM | POA: Diagnosis not present

## 2019-08-18 DIAGNOSIS — L732 Hidradenitis suppurativa: Secondary | ICD-10-CM | POA: Diagnosis not present

## 2019-08-18 DIAGNOSIS — J208 Acute bronchitis due to other specified organisms: Secondary | ICD-10-CM | POA: Diagnosis not present

## 2019-08-18 DIAGNOSIS — F3341 Major depressive disorder, recurrent, in partial remission: Secondary | ICD-10-CM | POA: Diagnosis not present

## 2019-08-18 DIAGNOSIS — L739 Follicular disorder, unspecified: Secondary | ICD-10-CM | POA: Diagnosis not present

## 2019-08-26 DIAGNOSIS — F3341 Major depressive disorder, recurrent, in partial remission: Secondary | ICD-10-CM | POA: Diagnosis not present

## 2019-08-26 DIAGNOSIS — R945 Abnormal results of liver function studies: Secondary | ICD-10-CM | POA: Diagnosis not present

## 2019-08-26 DIAGNOSIS — L732 Hidradenitis suppurativa: Secondary | ICD-10-CM | POA: Diagnosis not present

## 2019-08-26 DIAGNOSIS — J208 Acute bronchitis due to other specified organisms: Secondary | ICD-10-CM | POA: Diagnosis not present

## 2019-08-29 DIAGNOSIS — Z9689 Presence of other specified functional implants: Secondary | ICD-10-CM | POA: Diagnosis not present

## 2019-08-29 DIAGNOSIS — G894 Chronic pain syndrome: Secondary | ICD-10-CM | POA: Diagnosis not present

## 2019-08-29 DIAGNOSIS — M542 Cervicalgia: Secondary | ICD-10-CM | POA: Diagnosis not present

## 2019-08-29 DIAGNOSIS — M5440 Lumbago with sciatica, unspecified side: Secondary | ICD-10-CM | POA: Diagnosis not present

## 2019-09-02 DIAGNOSIS — L732 Hidradenitis suppurativa: Secondary | ICD-10-CM | POA: Diagnosis not present

## 2019-09-02 DIAGNOSIS — J208 Acute bronchitis due to other specified organisms: Secondary | ICD-10-CM | POA: Diagnosis not present

## 2019-09-02 DIAGNOSIS — R945 Abnormal results of liver function studies: Secondary | ICD-10-CM | POA: Diagnosis not present

## 2019-09-02 DIAGNOSIS — F3341 Major depressive disorder, recurrent, in partial remission: Secondary | ICD-10-CM | POA: Diagnosis not present

## 2019-09-16 DIAGNOSIS — I10 Essential (primary) hypertension: Secondary | ICD-10-CM | POA: Diagnosis not present

## 2019-09-16 DIAGNOSIS — F3341 Major depressive disorder, recurrent, in partial remission: Secondary | ICD-10-CM | POA: Diagnosis not present

## 2019-09-16 DIAGNOSIS — L732 Hidradenitis suppurativa: Secondary | ICD-10-CM | POA: Diagnosis not present

## 2019-09-16 DIAGNOSIS — R519 Headache, unspecified: Secondary | ICD-10-CM | POA: Diagnosis not present

## 2019-10-14 DIAGNOSIS — R519 Headache, unspecified: Secondary | ICD-10-CM | POA: Diagnosis not present

## 2019-10-14 DIAGNOSIS — I10 Essential (primary) hypertension: Secondary | ICD-10-CM | POA: Diagnosis not present

## 2019-10-14 DIAGNOSIS — F3341 Major depressive disorder, recurrent, in partial remission: Secondary | ICD-10-CM | POA: Diagnosis not present

## 2019-10-14 DIAGNOSIS — R945 Abnormal results of liver function studies: Secondary | ICD-10-CM | POA: Diagnosis not present

## 2019-10-27 DIAGNOSIS — Z9689 Presence of other specified functional implants: Secondary | ICD-10-CM | POA: Diagnosis not present

## 2019-10-27 DIAGNOSIS — M533 Sacrococcygeal disorders, not elsewhere classified: Secondary | ICD-10-CM | POA: Diagnosis not present

## 2019-10-27 DIAGNOSIS — M549 Dorsalgia, unspecified: Secondary | ICD-10-CM | POA: Diagnosis not present

## 2019-10-27 DIAGNOSIS — G894 Chronic pain syndrome: Secondary | ICD-10-CM | POA: Diagnosis not present

## 2019-11-12 DIAGNOSIS — L732 Hidradenitis suppurativa: Secondary | ICD-10-CM | POA: Diagnosis not present

## 2019-11-12 DIAGNOSIS — R110 Nausea: Secondary | ICD-10-CM | POA: Diagnosis not present

## 2019-11-12 DIAGNOSIS — R945 Abnormal results of liver function studies: Secondary | ICD-10-CM | POA: Diagnosis not present

## 2019-11-12 DIAGNOSIS — F3341 Major depressive disorder, recurrent, in partial remission: Secondary | ICD-10-CM | POA: Diagnosis not present

## 2019-11-26 DIAGNOSIS — F3341 Major depressive disorder, recurrent, in partial remission: Secondary | ICD-10-CM | POA: Diagnosis not present

## 2019-11-26 DIAGNOSIS — E039 Hypothyroidism, unspecified: Secondary | ICD-10-CM | POA: Diagnosis not present

## 2019-11-26 DIAGNOSIS — I10 Essential (primary) hypertension: Secondary | ICD-10-CM | POA: Diagnosis not present

## 2019-11-26 DIAGNOSIS — R945 Abnormal results of liver function studies: Secondary | ICD-10-CM | POA: Diagnosis not present

## 2019-11-26 DIAGNOSIS — L0390 Cellulitis, unspecified: Secondary | ICD-10-CM | POA: Diagnosis not present

## 2019-11-26 DIAGNOSIS — E118 Type 2 diabetes mellitus with unspecified complications: Secondary | ICD-10-CM | POA: Diagnosis not present

## 2019-11-26 DIAGNOSIS — E7800 Pure hypercholesterolemia, unspecified: Secondary | ICD-10-CM | POA: Diagnosis not present

## 2019-12-12 DIAGNOSIS — L0390 Cellulitis, unspecified: Secondary | ICD-10-CM | POA: Diagnosis not present

## 2019-12-12 DIAGNOSIS — F3341 Major depressive disorder, recurrent, in partial remission: Secondary | ICD-10-CM | POA: Diagnosis not present

## 2019-12-12 DIAGNOSIS — J028 Acute pharyngitis due to other specified organisms: Secondary | ICD-10-CM | POA: Diagnosis not present

## 2019-12-12 DIAGNOSIS — R945 Abnormal results of liver function studies: Secondary | ICD-10-CM | POA: Diagnosis not present

## 2019-12-22 DIAGNOSIS — M542 Cervicalgia: Secondary | ICD-10-CM | POA: Diagnosis not present

## 2019-12-22 DIAGNOSIS — Z5181 Encounter for therapeutic drug level monitoring: Secondary | ICD-10-CM | POA: Diagnosis not present

## 2019-12-22 DIAGNOSIS — M5442 Lumbago with sciatica, left side: Secondary | ICD-10-CM | POA: Diagnosis not present

## 2019-12-22 DIAGNOSIS — M549 Dorsalgia, unspecified: Secondary | ICD-10-CM | POA: Diagnosis not present

## 2019-12-22 DIAGNOSIS — M7918 Myalgia, other site: Secondary | ICD-10-CM | POA: Diagnosis not present

## 2019-12-22 DIAGNOSIS — M5136 Other intervertebral disc degeneration, lumbar region: Secondary | ICD-10-CM | POA: Diagnosis not present

## 2019-12-22 DIAGNOSIS — M533 Sacrococcygeal disorders, not elsewhere classified: Secondary | ICD-10-CM | POA: Diagnosis not present

## 2019-12-22 DIAGNOSIS — M5440 Lumbago with sciatica, unspecified side: Secondary | ICD-10-CM | POA: Diagnosis not present

## 2019-12-22 DIAGNOSIS — Z79899 Other long term (current) drug therapy: Secondary | ICD-10-CM | POA: Diagnosis not present

## 2019-12-22 DIAGNOSIS — Z79891 Long term (current) use of opiate analgesic: Secondary | ICD-10-CM | POA: Diagnosis not present

## 2019-12-22 DIAGNOSIS — G609 Hereditary and idiopathic neuropathy, unspecified: Secondary | ICD-10-CM | POA: Diagnosis not present

## 2019-12-22 DIAGNOSIS — E663 Overweight: Secondary | ICD-10-CM | POA: Diagnosis not present

## 2019-12-22 DIAGNOSIS — G894 Chronic pain syndrome: Secondary | ICD-10-CM | POA: Diagnosis not present

## 2019-12-22 DIAGNOSIS — Z9689 Presence of other specified functional implants: Secondary | ICD-10-CM | POA: Diagnosis not present

## 2020-02-11 DIAGNOSIS — E118 Type 2 diabetes mellitus with unspecified complications: Secondary | ICD-10-CM | POA: Diagnosis not present

## 2020-02-11 DIAGNOSIS — R519 Headache, unspecified: Secondary | ICD-10-CM | POA: Diagnosis not present

## 2020-02-11 DIAGNOSIS — F3341 Major depressive disorder, recurrent, in partial remission: Secondary | ICD-10-CM | POA: Diagnosis not present

## 2020-02-11 DIAGNOSIS — E1142 Type 2 diabetes mellitus with diabetic polyneuropathy: Secondary | ICD-10-CM | POA: Diagnosis not present

## 2020-02-11 DIAGNOSIS — E7800 Pure hypercholesterolemia, unspecified: Secondary | ICD-10-CM | POA: Diagnosis not present

## 2020-02-11 DIAGNOSIS — R600 Localized edema: Secondary | ICD-10-CM | POA: Diagnosis not present

## 2020-02-11 DIAGNOSIS — G8929 Other chronic pain: Secondary | ICD-10-CM | POA: Diagnosis not present

## 2020-02-11 DIAGNOSIS — M797 Fibromyalgia: Secondary | ICD-10-CM | POA: Diagnosis not present

## 2020-02-11 DIAGNOSIS — R911 Solitary pulmonary nodule: Secondary | ICD-10-CM | POA: Diagnosis not present

## 2020-02-11 DIAGNOSIS — M159 Polyosteoarthritis, unspecified: Secondary | ICD-10-CM | POA: Diagnosis not present

## 2020-02-11 DIAGNOSIS — I10 Essential (primary) hypertension: Secondary | ICD-10-CM | POA: Diagnosis not present

## 2020-02-11 DIAGNOSIS — R945 Abnormal results of liver function studies: Secondary | ICD-10-CM | POA: Diagnosis not present

## 2020-02-24 DIAGNOSIS — Z6841 Body Mass Index (BMI) 40.0 and over, adult: Secondary | ICD-10-CM | POA: Diagnosis not present

## 2020-02-24 DIAGNOSIS — Z1231 Encounter for screening mammogram for malignant neoplasm of breast: Secondary | ICD-10-CM | POA: Diagnosis not present

## 2020-02-24 DIAGNOSIS — Z124 Encounter for screening for malignant neoplasm of cervix: Secondary | ICD-10-CM | POA: Diagnosis not present

## 2020-03-10 DIAGNOSIS — E7800 Pure hypercholesterolemia, unspecified: Secondary | ICD-10-CM | POA: Diagnosis not present

## 2020-03-10 DIAGNOSIS — G8929 Other chronic pain: Secondary | ICD-10-CM | POA: Diagnosis not present

## 2020-03-10 DIAGNOSIS — F3341 Major depressive disorder, recurrent, in partial remission: Secondary | ICD-10-CM | POA: Diagnosis not present

## 2020-03-10 DIAGNOSIS — R519 Headache, unspecified: Secondary | ICD-10-CM | POA: Diagnosis not present

## 2020-03-10 DIAGNOSIS — E039 Hypothyroidism, unspecified: Secondary | ICD-10-CM | POA: Diagnosis not present

## 2020-03-10 DIAGNOSIS — M159 Polyosteoarthritis, unspecified: Secondary | ICD-10-CM | POA: Diagnosis not present

## 2020-03-10 DIAGNOSIS — R945 Abnormal results of liver function studies: Secondary | ICD-10-CM | POA: Diagnosis not present

## 2020-03-10 DIAGNOSIS — I10 Essential (primary) hypertension: Secondary | ICD-10-CM | POA: Diagnosis not present

## 2020-03-10 DIAGNOSIS — R600 Localized edema: Secondary | ICD-10-CM | POA: Diagnosis not present

## 2020-03-10 DIAGNOSIS — M797 Fibromyalgia: Secondary | ICD-10-CM | POA: Diagnosis not present

## 2020-03-10 DIAGNOSIS — R911 Solitary pulmonary nodule: Secondary | ICD-10-CM | POA: Diagnosis not present

## 2020-03-10 DIAGNOSIS — E1142 Type 2 diabetes mellitus with diabetic polyneuropathy: Secondary | ICD-10-CM | POA: Diagnosis not present

## 2020-03-19 DIAGNOSIS — G609 Hereditary and idiopathic neuropathy, unspecified: Secondary | ICD-10-CM | POA: Diagnosis not present

## 2020-03-19 DIAGNOSIS — E663 Overweight: Secondary | ICD-10-CM | POA: Diagnosis not present

## 2020-03-19 DIAGNOSIS — M542 Cervicalgia: Secondary | ICD-10-CM | POA: Diagnosis not present

## 2020-03-19 DIAGNOSIS — M7918 Myalgia, other site: Secondary | ICD-10-CM | POA: Diagnosis not present

## 2020-03-25 DIAGNOSIS — T85193A Other mechanical complication of implanted electronic neurostimulator, generator, initial encounter: Secondary | ICD-10-CM | POA: Diagnosis not present

## 2020-04-07 DIAGNOSIS — E7800 Pure hypercholesterolemia, unspecified: Secondary | ICD-10-CM | POA: Diagnosis not present

## 2020-04-07 DIAGNOSIS — I10 Essential (primary) hypertension: Secondary | ICD-10-CM | POA: Diagnosis not present

## 2020-04-07 DIAGNOSIS — E118 Type 2 diabetes mellitus with unspecified complications: Secondary | ICD-10-CM | POA: Diagnosis not present

## 2020-04-07 DIAGNOSIS — M797 Fibromyalgia: Secondary | ICD-10-CM | POA: Diagnosis not present

## 2020-04-07 DIAGNOSIS — R945 Abnormal results of liver function studies: Secondary | ICD-10-CM | POA: Diagnosis not present

## 2020-04-07 DIAGNOSIS — G8929 Other chronic pain: Secondary | ICD-10-CM | POA: Diagnosis not present

## 2020-04-07 DIAGNOSIS — R911 Solitary pulmonary nodule: Secondary | ICD-10-CM | POA: Diagnosis not present

## 2020-04-07 DIAGNOSIS — R519 Headache, unspecified: Secondary | ICD-10-CM | POA: Diagnosis not present

## 2020-04-07 DIAGNOSIS — E1142 Type 2 diabetes mellitus with diabetic polyneuropathy: Secondary | ICD-10-CM | POA: Diagnosis not present

## 2020-04-07 DIAGNOSIS — M159 Polyosteoarthritis, unspecified: Secondary | ICD-10-CM | POA: Diagnosis not present

## 2020-04-07 DIAGNOSIS — R600 Localized edema: Secondary | ICD-10-CM | POA: Diagnosis not present

## 2020-04-07 DIAGNOSIS — F3341 Major depressive disorder, recurrent, in partial remission: Secondary | ICD-10-CM | POA: Diagnosis not present

## 2020-05-06 DIAGNOSIS — R911 Solitary pulmonary nodule: Secondary | ICD-10-CM | POA: Diagnosis not present

## 2020-05-06 DIAGNOSIS — R600 Localized edema: Secondary | ICD-10-CM | POA: Diagnosis not present

## 2020-05-06 DIAGNOSIS — E118 Type 2 diabetes mellitus with unspecified complications: Secondary | ICD-10-CM | POA: Diagnosis not present

## 2020-05-06 DIAGNOSIS — I10 Essential (primary) hypertension: Secondary | ICD-10-CM | POA: Diagnosis not present

## 2020-05-06 DIAGNOSIS — G8929 Other chronic pain: Secondary | ICD-10-CM | POA: Diagnosis not present

## 2020-05-06 DIAGNOSIS — F3341 Major depressive disorder, recurrent, in partial remission: Secondary | ICD-10-CM | POA: Diagnosis not present

## 2020-05-06 DIAGNOSIS — M797 Fibromyalgia: Secondary | ICD-10-CM | POA: Diagnosis not present

## 2020-05-06 DIAGNOSIS — R945 Abnormal results of liver function studies: Secondary | ICD-10-CM | POA: Diagnosis not present

## 2020-05-06 DIAGNOSIS — E1142 Type 2 diabetes mellitus with diabetic polyneuropathy: Secondary | ICD-10-CM | POA: Diagnosis not present

## 2020-05-06 DIAGNOSIS — E7800 Pure hypercholesterolemia, unspecified: Secondary | ICD-10-CM | POA: Diagnosis not present

## 2020-05-06 DIAGNOSIS — M159 Polyosteoarthritis, unspecified: Secondary | ICD-10-CM | POA: Diagnosis not present

## 2020-05-06 DIAGNOSIS — R519 Headache, unspecified: Secondary | ICD-10-CM | POA: Diagnosis not present

## 2020-05-18 DIAGNOSIS — M25511 Pain in right shoulder: Secondary | ICD-10-CM | POA: Diagnosis not present

## 2020-05-18 DIAGNOSIS — M5412 Radiculopathy, cervical region: Secondary | ICD-10-CM | POA: Diagnosis not present

## 2020-05-20 DIAGNOSIS — E118 Type 2 diabetes mellitus with unspecified complications: Secondary | ICD-10-CM | POA: Diagnosis not present

## 2020-05-20 DIAGNOSIS — Z1331 Encounter for screening for depression: Secondary | ICD-10-CM | POA: Diagnosis not present

## 2020-05-20 DIAGNOSIS — R600 Localized edema: Secondary | ICD-10-CM | POA: Diagnosis not present

## 2020-05-20 DIAGNOSIS — F3341 Major depressive disorder, recurrent, in partial remission: Secondary | ICD-10-CM | POA: Diagnosis not present

## 2020-05-20 DIAGNOSIS — M25511 Pain in right shoulder: Secondary | ICD-10-CM | POA: Diagnosis not present

## 2020-05-20 DIAGNOSIS — I10 Essential (primary) hypertension: Secondary | ICD-10-CM | POA: Diagnosis not present

## 2020-05-20 DIAGNOSIS — Z23 Encounter for immunization: Secondary | ICD-10-CM | POA: Diagnosis not present

## 2020-05-20 DIAGNOSIS — R945 Abnormal results of liver function studies: Secondary | ICD-10-CM | POA: Diagnosis not present

## 2020-05-20 DIAGNOSIS — M797 Fibromyalgia: Secondary | ICD-10-CM | POA: Diagnosis not present

## 2020-05-20 DIAGNOSIS — R519 Headache, unspecified: Secondary | ICD-10-CM | POA: Diagnosis not present

## 2020-05-20 DIAGNOSIS — E7800 Pure hypercholesterolemia, unspecified: Secondary | ICD-10-CM | POA: Diagnosis not present

## 2020-05-20 DIAGNOSIS — G8929 Other chronic pain: Secondary | ICD-10-CM | POA: Diagnosis not present

## 2020-05-25 DIAGNOSIS — R945 Abnormal results of liver function studies: Secondary | ICD-10-CM | POA: Diagnosis not present

## 2020-05-25 DIAGNOSIS — E7800 Pure hypercholesterolemia, unspecified: Secondary | ICD-10-CM | POA: Diagnosis not present

## 2020-05-25 DIAGNOSIS — M797 Fibromyalgia: Secondary | ICD-10-CM | POA: Diagnosis not present

## 2020-05-25 DIAGNOSIS — G8929 Other chronic pain: Secondary | ICD-10-CM | POA: Diagnosis not present

## 2020-05-25 DIAGNOSIS — F3341 Major depressive disorder, recurrent, in partial remission: Secondary | ICD-10-CM | POA: Diagnosis not present

## 2020-05-25 DIAGNOSIS — I10 Essential (primary) hypertension: Secondary | ICD-10-CM | POA: Diagnosis not present

## 2020-05-25 DIAGNOSIS — E118 Type 2 diabetes mellitus with unspecified complications: Secondary | ICD-10-CM | POA: Diagnosis not present

## 2020-05-25 DIAGNOSIS — R519 Headache, unspecified: Secondary | ICD-10-CM | POA: Diagnosis not present

## 2020-05-25 DIAGNOSIS — M7711 Lateral epicondylitis, right elbow: Secondary | ICD-10-CM | POA: Diagnosis not present

## 2020-05-25 DIAGNOSIS — R600 Localized edema: Secondary | ICD-10-CM | POA: Diagnosis not present

## 2020-05-25 DIAGNOSIS — K047 Periapical abscess without sinus: Secondary | ICD-10-CM | POA: Diagnosis not present

## 2020-05-25 DIAGNOSIS — E1142 Type 2 diabetes mellitus with diabetic polyneuropathy: Secondary | ICD-10-CM | POA: Diagnosis not present

## 2020-05-31 DIAGNOSIS — R945 Abnormal results of liver function studies: Secondary | ICD-10-CM | POA: Diagnosis not present

## 2020-05-31 DIAGNOSIS — G8929 Other chronic pain: Secondary | ICD-10-CM | POA: Diagnosis not present

## 2020-05-31 DIAGNOSIS — F3341 Major depressive disorder, recurrent, in partial remission: Secondary | ICD-10-CM | POA: Diagnosis not present

## 2020-05-31 DIAGNOSIS — M25511 Pain in right shoulder: Secondary | ICD-10-CM | POA: Diagnosis not present

## 2020-05-31 DIAGNOSIS — R600 Localized edema: Secondary | ICD-10-CM | POA: Diagnosis not present

## 2020-05-31 DIAGNOSIS — E1142 Type 2 diabetes mellitus with diabetic polyneuropathy: Secondary | ICD-10-CM | POA: Diagnosis not present

## 2020-05-31 DIAGNOSIS — R519 Headache, unspecified: Secondary | ICD-10-CM | POA: Diagnosis not present

## 2020-05-31 DIAGNOSIS — E7800 Pure hypercholesterolemia, unspecified: Secondary | ICD-10-CM | POA: Diagnosis not present

## 2020-05-31 DIAGNOSIS — I10 Essential (primary) hypertension: Secondary | ICD-10-CM | POA: Diagnosis not present

## 2020-05-31 DIAGNOSIS — E118 Type 2 diabetes mellitus with unspecified complications: Secondary | ICD-10-CM | POA: Diagnosis not present

## 2020-05-31 DIAGNOSIS — M159 Polyosteoarthritis, unspecified: Secondary | ICD-10-CM | POA: Diagnosis not present

## 2020-05-31 DIAGNOSIS — M797 Fibromyalgia: Secondary | ICD-10-CM | POA: Diagnosis not present

## 2020-06-03 DIAGNOSIS — E1142 Type 2 diabetes mellitus with diabetic polyneuropathy: Secondary | ICD-10-CM | POA: Diagnosis not present

## 2020-06-03 DIAGNOSIS — R519 Headache, unspecified: Secondary | ICD-10-CM | POA: Diagnosis not present

## 2020-06-03 DIAGNOSIS — M797 Fibromyalgia: Secondary | ICD-10-CM | POA: Diagnosis not present

## 2020-06-03 DIAGNOSIS — I10 Essential (primary) hypertension: Secondary | ICD-10-CM | POA: Diagnosis not present

## 2020-06-03 DIAGNOSIS — R110 Nausea: Secondary | ICD-10-CM | POA: Diagnosis not present

## 2020-06-03 DIAGNOSIS — R600 Localized edema: Secondary | ICD-10-CM | POA: Diagnosis not present

## 2020-06-03 DIAGNOSIS — E7800 Pure hypercholesterolemia, unspecified: Secondary | ICD-10-CM | POA: Diagnosis not present

## 2020-06-03 DIAGNOSIS — E118 Type 2 diabetes mellitus with unspecified complications: Secondary | ICD-10-CM | POA: Diagnosis not present

## 2020-06-03 DIAGNOSIS — F3341 Major depressive disorder, recurrent, in partial remission: Secondary | ICD-10-CM | POA: Diagnosis not present

## 2020-06-03 DIAGNOSIS — G8929 Other chronic pain: Secondary | ICD-10-CM | POA: Diagnosis not present

## 2020-06-03 DIAGNOSIS — R945 Abnormal results of liver function studies: Secondary | ICD-10-CM | POA: Diagnosis not present

## 2020-06-03 DIAGNOSIS — M159 Polyosteoarthritis, unspecified: Secondary | ICD-10-CM | POA: Diagnosis not present

## 2020-06-07 DIAGNOSIS — M5090 Cervical disc disorder, unspecified, unspecified cervical region: Secondary | ICD-10-CM | POA: Diagnosis not present

## 2020-06-07 DIAGNOSIS — M5412 Radiculopathy, cervical region: Secondary | ICD-10-CM | POA: Diagnosis not present

## 2020-06-14 DIAGNOSIS — Z79899 Other long term (current) drug therapy: Secondary | ICD-10-CM | POA: Diagnosis not present

## 2020-06-14 DIAGNOSIS — Z5181 Encounter for therapeutic drug level monitoring: Secondary | ICD-10-CM | POA: Diagnosis not present

## 2020-06-15 DIAGNOSIS — M6281 Muscle weakness (generalized): Secondary | ICD-10-CM | POA: Diagnosis not present

## 2020-06-15 DIAGNOSIS — M542 Cervicalgia: Secondary | ICD-10-CM | POA: Diagnosis not present

## 2020-06-17 DIAGNOSIS — M6281 Muscle weakness (generalized): Secondary | ICD-10-CM | POA: Diagnosis not present

## 2020-06-17 DIAGNOSIS — M159 Polyosteoarthritis, unspecified: Secondary | ICD-10-CM | POA: Diagnosis not present

## 2020-06-17 DIAGNOSIS — R519 Headache, unspecified: Secondary | ICD-10-CM | POA: Diagnosis not present

## 2020-06-17 DIAGNOSIS — R911 Solitary pulmonary nodule: Secondary | ICD-10-CM | POA: Diagnosis not present

## 2020-06-17 DIAGNOSIS — R945 Abnormal results of liver function studies: Secondary | ICD-10-CM | POA: Diagnosis not present

## 2020-06-17 DIAGNOSIS — E1142 Type 2 diabetes mellitus with diabetic polyneuropathy: Secondary | ICD-10-CM | POA: Diagnosis not present

## 2020-06-17 DIAGNOSIS — E118 Type 2 diabetes mellitus with unspecified complications: Secondary | ICD-10-CM | POA: Diagnosis not present

## 2020-06-17 DIAGNOSIS — M797 Fibromyalgia: Secondary | ICD-10-CM | POA: Diagnosis not present

## 2020-06-17 DIAGNOSIS — E7800 Pure hypercholesterolemia, unspecified: Secondary | ICD-10-CM | POA: Diagnosis not present

## 2020-06-17 DIAGNOSIS — M542 Cervicalgia: Secondary | ICD-10-CM | POA: Diagnosis not present

## 2020-06-17 DIAGNOSIS — R600 Localized edema: Secondary | ICD-10-CM | POA: Diagnosis not present

## 2020-06-17 DIAGNOSIS — G8929 Other chronic pain: Secondary | ICD-10-CM | POA: Diagnosis not present

## 2020-06-17 DIAGNOSIS — F3341 Major depressive disorder, recurrent, in partial remission: Secondary | ICD-10-CM | POA: Diagnosis not present

## 2020-06-17 DIAGNOSIS — I10 Essential (primary) hypertension: Secondary | ICD-10-CM | POA: Diagnosis not present

## 2020-06-17 DIAGNOSIS — E039 Hypothyroidism, unspecified: Secondary | ICD-10-CM | POA: Diagnosis not present

## 2020-06-22 DIAGNOSIS — M542 Cervicalgia: Secondary | ICD-10-CM | POA: Diagnosis not present

## 2020-06-22 DIAGNOSIS — M6281 Muscle weakness (generalized): Secondary | ICD-10-CM | POA: Diagnosis not present

## 2020-06-28 DIAGNOSIS — M6281 Muscle weakness (generalized): Secondary | ICD-10-CM | POA: Diagnosis not present

## 2020-06-28 DIAGNOSIS — M542 Cervicalgia: Secondary | ICD-10-CM | POA: Diagnosis not present

## 2020-06-29 DIAGNOSIS — Z23 Encounter for immunization: Secondary | ICD-10-CM | POA: Diagnosis not present

## 2020-07-01 DIAGNOSIS — M6281 Muscle weakness (generalized): Secondary | ICD-10-CM | POA: Diagnosis not present

## 2020-07-01 DIAGNOSIS — R945 Abnormal results of liver function studies: Secondary | ICD-10-CM | POA: Diagnosis not present

## 2020-07-01 DIAGNOSIS — E039 Hypothyroidism, unspecified: Secondary | ICD-10-CM | POA: Diagnosis not present

## 2020-07-01 DIAGNOSIS — F5081 Binge eating disorder: Secondary | ICD-10-CM | POA: Diagnosis not present

## 2020-07-01 DIAGNOSIS — M542 Cervicalgia: Secondary | ICD-10-CM | POA: Diagnosis not present

## 2020-07-01 DIAGNOSIS — M159 Polyosteoarthritis, unspecified: Secondary | ICD-10-CM | POA: Diagnosis not present

## 2020-07-01 DIAGNOSIS — F5101 Primary insomnia: Secondary | ICD-10-CM | POA: Diagnosis not present

## 2020-07-01 DIAGNOSIS — F3341 Major depressive disorder, recurrent, in partial remission: Secondary | ICD-10-CM | POA: Diagnosis not present

## 2020-07-01 DIAGNOSIS — M797 Fibromyalgia: Secondary | ICD-10-CM | POA: Diagnosis not present

## 2020-07-01 DIAGNOSIS — I10 Essential (primary) hypertension: Secondary | ICD-10-CM | POA: Diagnosis not present

## 2020-07-01 DIAGNOSIS — R519 Headache, unspecified: Secondary | ICD-10-CM | POA: Diagnosis not present

## 2020-07-01 DIAGNOSIS — E1142 Type 2 diabetes mellitus with diabetic polyneuropathy: Secondary | ICD-10-CM | POA: Diagnosis not present

## 2020-07-01 DIAGNOSIS — G8929 Other chronic pain: Secondary | ICD-10-CM | POA: Diagnosis not present

## 2020-07-01 DIAGNOSIS — R600 Localized edema: Secondary | ICD-10-CM | POA: Diagnosis not present

## 2020-07-05 DIAGNOSIS — M542 Cervicalgia: Secondary | ICD-10-CM | POA: Diagnosis not present

## 2020-07-05 DIAGNOSIS — M6281 Muscle weakness (generalized): Secondary | ICD-10-CM | POA: Diagnosis not present

## 2020-07-06 DIAGNOSIS — E119 Type 2 diabetes mellitus without complications: Secondary | ICD-10-CM | POA: Diagnosis not present

## 2020-07-06 DIAGNOSIS — Z794 Long term (current) use of insulin: Secondary | ICD-10-CM | POA: Diagnosis not present

## 2020-07-06 DIAGNOSIS — T85193A Other mechanical complication of implanted electronic neurostimulator, generator, initial encounter: Secondary | ICD-10-CM | POA: Diagnosis not present

## 2020-07-06 DIAGNOSIS — M533 Sacrococcygeal disorders, not elsewhere classified: Secondary | ICD-10-CM | POA: Diagnosis not present

## 2020-07-06 DIAGNOSIS — F419 Anxiety disorder, unspecified: Secondary | ICD-10-CM | POA: Diagnosis not present

## 2020-07-06 DIAGNOSIS — Z79891 Long term (current) use of opiate analgesic: Secondary | ICD-10-CM | POA: Diagnosis not present

## 2020-07-06 DIAGNOSIS — M545 Low back pain, unspecified: Secondary | ICD-10-CM | POA: Diagnosis not present

## 2020-07-06 DIAGNOSIS — Z7984 Long term (current) use of oral hypoglycemic drugs: Secondary | ICD-10-CM | POA: Diagnosis not present

## 2020-07-06 DIAGNOSIS — Z79899 Other long term (current) drug therapy: Secondary | ICD-10-CM | POA: Diagnosis not present

## 2020-07-06 DIAGNOSIS — G894 Chronic pain syndrome: Secondary | ICD-10-CM | POA: Diagnosis not present

## 2020-07-06 DIAGNOSIS — Z4542 Encounter for adjustment and management of neuropacemaker (brain) (peripheral nerve) (spinal cord): Secondary | ICD-10-CM | POA: Diagnosis not present

## 2020-07-06 DIAGNOSIS — E039 Hypothyroidism, unspecified: Secondary | ICD-10-CM | POA: Diagnosis not present

## 2020-07-06 DIAGNOSIS — Z79890 Hormone replacement therapy (postmenopausal): Secondary | ICD-10-CM | POA: Diagnosis not present

## 2020-07-06 DIAGNOSIS — F4310 Post-traumatic stress disorder, unspecified: Secondary | ICD-10-CM | POA: Diagnosis not present

## 2020-07-06 DIAGNOSIS — Z6841 Body Mass Index (BMI) 40.0 and over, adult: Secondary | ICD-10-CM | POA: Diagnosis not present

## 2020-07-06 DIAGNOSIS — M5416 Radiculopathy, lumbar region: Secondary | ICD-10-CM | POA: Diagnosis not present

## 2020-07-06 DIAGNOSIS — Z87891 Personal history of nicotine dependence: Secondary | ICD-10-CM | POA: Diagnosis not present

## 2020-07-21 DIAGNOSIS — G8929 Other chronic pain: Secondary | ICD-10-CM | POA: Diagnosis not present

## 2020-07-21 DIAGNOSIS — M5442 Lumbago with sciatica, left side: Secondary | ICD-10-CM | POA: Diagnosis not present

## 2020-07-21 DIAGNOSIS — M5441 Lumbago with sciatica, right side: Secondary | ICD-10-CM | POA: Diagnosis not present

## 2020-07-21 DIAGNOSIS — G894 Chronic pain syndrome: Secondary | ICD-10-CM | POA: Diagnosis not present

## 2020-07-21 DIAGNOSIS — Z9689 Presence of other specified functional implants: Secondary | ICD-10-CM | POA: Diagnosis not present

## 2020-07-26 DIAGNOSIS — M5090 Cervical disc disorder, unspecified, unspecified cervical region: Secondary | ICD-10-CM | POA: Diagnosis not present

## 2020-07-26 DIAGNOSIS — M5412 Radiculopathy, cervical region: Secondary | ICD-10-CM | POA: Diagnosis not present

## 2020-07-29 DIAGNOSIS — Z6841 Body Mass Index (BMI) 40.0 and over, adult: Secondary | ICD-10-CM | POA: Diagnosis not present

## 2020-07-29 DIAGNOSIS — Z20822 Contact with and (suspected) exposure to covid-19: Secondary | ICD-10-CM | POA: Diagnosis not present

## 2020-07-29 DIAGNOSIS — F3341 Major depressive disorder, recurrent, in partial remission: Secondary | ICD-10-CM | POA: Diagnosis not present

## 2020-07-29 DIAGNOSIS — J029 Acute pharyngitis, unspecified: Secondary | ICD-10-CM | POA: Diagnosis not present

## 2020-07-29 DIAGNOSIS — E118 Type 2 diabetes mellitus with unspecified complications: Secondary | ICD-10-CM | POA: Diagnosis not present

## 2020-07-29 DIAGNOSIS — R6889 Other general symptoms and signs: Secondary | ICD-10-CM | POA: Diagnosis not present

## 2020-07-29 DIAGNOSIS — I10 Essential (primary) hypertension: Secondary | ICD-10-CM | POA: Diagnosis not present

## 2020-07-30 DIAGNOSIS — R600 Localized edema: Secondary | ICD-10-CM | POA: Diagnosis not present

## 2020-07-30 DIAGNOSIS — R945 Abnormal results of liver function studies: Secondary | ICD-10-CM | POA: Diagnosis not present

## 2020-07-30 DIAGNOSIS — M797 Fibromyalgia: Secondary | ICD-10-CM | POA: Diagnosis not present

## 2020-07-30 DIAGNOSIS — R911 Solitary pulmonary nodule: Secondary | ICD-10-CM | POA: Diagnosis not present

## 2020-07-30 DIAGNOSIS — G8929 Other chronic pain: Secondary | ICD-10-CM | POA: Diagnosis not present

## 2020-07-30 DIAGNOSIS — F5101 Primary insomnia: Secondary | ICD-10-CM | POA: Diagnosis not present

## 2020-07-30 DIAGNOSIS — M159 Polyosteoarthritis, unspecified: Secondary | ICD-10-CM | POA: Diagnosis not present

## 2020-07-30 DIAGNOSIS — I10 Essential (primary) hypertension: Secondary | ICD-10-CM | POA: Diagnosis not present

## 2020-07-30 DIAGNOSIS — E1142 Type 2 diabetes mellitus with diabetic polyneuropathy: Secondary | ICD-10-CM | POA: Diagnosis not present

## 2020-07-30 DIAGNOSIS — F3341 Major depressive disorder, recurrent, in partial remission: Secondary | ICD-10-CM | POA: Diagnosis not present

## 2020-07-30 DIAGNOSIS — R519 Headache, unspecified: Secondary | ICD-10-CM | POA: Diagnosis not present

## 2020-07-30 DIAGNOSIS — F5081 Binge eating disorder: Secondary | ICD-10-CM | POA: Diagnosis not present

## 2020-08-05 DIAGNOSIS — M50222 Other cervical disc displacement at C5-C6 level: Secondary | ICD-10-CM | POA: Diagnosis not present

## 2020-08-05 DIAGNOSIS — M50221 Other cervical disc displacement at C4-C5 level: Secondary | ICD-10-CM | POA: Diagnosis not present

## 2020-08-05 DIAGNOSIS — M50223 Other cervical disc displacement at C6-C7 level: Secondary | ICD-10-CM | POA: Diagnosis not present

## 2020-08-05 DIAGNOSIS — M4802 Spinal stenosis, cervical region: Secondary | ICD-10-CM | POA: Diagnosis not present

## 2020-08-05 DIAGNOSIS — M5412 Radiculopathy, cervical region: Secondary | ICD-10-CM | POA: Diagnosis not present

## 2020-08-11 DIAGNOSIS — E669 Obesity, unspecified: Secondary | ICD-10-CM | POA: Diagnosis not present

## 2020-08-11 DIAGNOSIS — Z0000 Encounter for general adult medical examination without abnormal findings: Secondary | ICD-10-CM | POA: Diagnosis not present

## 2020-08-11 DIAGNOSIS — Z1331 Encounter for screening for depression: Secondary | ICD-10-CM | POA: Diagnosis not present

## 2020-08-11 DIAGNOSIS — E785 Hyperlipidemia, unspecified: Secondary | ICD-10-CM | POA: Diagnosis not present

## 2020-08-11 DIAGNOSIS — Z9181 History of falling: Secondary | ICD-10-CM | POA: Diagnosis not present

## 2020-08-19 DIAGNOSIS — M5090 Cervical disc disorder, unspecified, unspecified cervical region: Secondary | ICD-10-CM | POA: Diagnosis not present

## 2020-08-19 DIAGNOSIS — M5412 Radiculopathy, cervical region: Secondary | ICD-10-CM | POA: Diagnosis not present

## 2020-08-19 DIAGNOSIS — M4802 Spinal stenosis, cervical region: Secondary | ICD-10-CM | POA: Diagnosis not present

## 2020-08-25 DIAGNOSIS — M47812 Spondylosis without myelopathy or radiculopathy, cervical region: Secondary | ICD-10-CM | POA: Diagnosis not present

## 2020-08-25 DIAGNOSIS — M5412 Radiculopathy, cervical region: Secondary | ICD-10-CM | POA: Diagnosis not present

## 2020-08-30 DIAGNOSIS — F5101 Primary insomnia: Secondary | ICD-10-CM | POA: Diagnosis not present

## 2020-08-30 DIAGNOSIS — G8929 Other chronic pain: Secondary | ICD-10-CM | POA: Diagnosis not present

## 2020-08-30 DIAGNOSIS — M159 Polyosteoarthritis, unspecified: Secondary | ICD-10-CM | POA: Diagnosis not present

## 2020-08-30 DIAGNOSIS — M797 Fibromyalgia: Secondary | ICD-10-CM | POA: Diagnosis not present

## 2020-08-30 DIAGNOSIS — R519 Headache, unspecified: Secondary | ICD-10-CM | POA: Diagnosis not present

## 2020-08-30 DIAGNOSIS — F3341 Major depressive disorder, recurrent, in partial remission: Secondary | ICD-10-CM | POA: Diagnosis not present

## 2020-08-30 DIAGNOSIS — E1142 Type 2 diabetes mellitus with diabetic polyneuropathy: Secondary | ICD-10-CM | POA: Diagnosis not present

## 2020-08-30 DIAGNOSIS — I10 Essential (primary) hypertension: Secondary | ICD-10-CM | POA: Diagnosis not present

## 2020-08-30 DIAGNOSIS — R911 Solitary pulmonary nodule: Secondary | ICD-10-CM | POA: Diagnosis not present

## 2020-08-30 DIAGNOSIS — R600 Localized edema: Secondary | ICD-10-CM | POA: Diagnosis not present

## 2020-08-30 DIAGNOSIS — F5081 Binge eating disorder: Secondary | ICD-10-CM | POA: Diagnosis not present

## 2020-08-30 DIAGNOSIS — R945 Abnormal results of liver function studies: Secondary | ICD-10-CM | POA: Diagnosis not present

## 2020-09-21 DIAGNOSIS — M79603 Pain in arm, unspecified: Secondary | ICD-10-CM | POA: Diagnosis not present

## 2020-09-21 DIAGNOSIS — Z01818 Encounter for other preprocedural examination: Secondary | ICD-10-CM | POA: Diagnosis not present

## 2020-09-21 DIAGNOSIS — Z79899 Other long term (current) drug therapy: Secondary | ICD-10-CM | POA: Diagnosis not present

## 2020-09-21 DIAGNOSIS — E559 Vitamin D deficiency, unspecified: Secondary | ICD-10-CM | POA: Diagnosis not present

## 2020-09-21 DIAGNOSIS — M5412 Radiculopathy, cervical region: Secondary | ICD-10-CM | POA: Diagnosis not present

## 2020-09-27 DIAGNOSIS — E1142 Type 2 diabetes mellitus with diabetic polyneuropathy: Secondary | ICD-10-CM | POA: Diagnosis not present

## 2020-09-27 DIAGNOSIS — E7800 Pure hypercholesterolemia, unspecified: Secondary | ICD-10-CM | POA: Diagnosis not present

## 2020-09-27 DIAGNOSIS — G8929 Other chronic pain: Secondary | ICD-10-CM | POA: Diagnosis not present

## 2020-09-27 DIAGNOSIS — I10 Essential (primary) hypertension: Secondary | ICD-10-CM | POA: Diagnosis not present

## 2020-09-27 DIAGNOSIS — R600 Localized edema: Secondary | ICD-10-CM | POA: Diagnosis not present

## 2020-09-27 DIAGNOSIS — F3341 Major depressive disorder, recurrent, in partial remission: Secondary | ICD-10-CM | POA: Diagnosis not present

## 2020-09-27 DIAGNOSIS — R519 Headache, unspecified: Secondary | ICD-10-CM | POA: Diagnosis not present

## 2020-09-27 DIAGNOSIS — Z01810 Encounter for preprocedural cardiovascular examination: Secondary | ICD-10-CM | POA: Diagnosis not present

## 2020-09-27 DIAGNOSIS — M159 Polyosteoarthritis, unspecified: Secondary | ICD-10-CM | POA: Diagnosis not present

## 2020-09-27 DIAGNOSIS — R911 Solitary pulmonary nodule: Secondary | ICD-10-CM | POA: Diagnosis not present

## 2020-09-27 DIAGNOSIS — R945 Abnormal results of liver function studies: Secondary | ICD-10-CM | POA: Diagnosis not present

## 2020-09-27 DIAGNOSIS — M797 Fibromyalgia: Secondary | ICD-10-CM | POA: Diagnosis not present

## 2020-09-29 DIAGNOSIS — I10 Essential (primary) hypertension: Secondary | ICD-10-CM | POA: Diagnosis not present

## 2020-09-29 DIAGNOSIS — R911 Solitary pulmonary nodule: Secondary | ICD-10-CM | POA: Diagnosis not present

## 2020-09-29 DIAGNOSIS — R600 Localized edema: Secondary | ICD-10-CM | POA: Diagnosis not present

## 2020-09-29 DIAGNOSIS — R519 Headache, unspecified: Secondary | ICD-10-CM | POA: Diagnosis not present

## 2020-09-29 DIAGNOSIS — Z1159 Encounter for screening for other viral diseases: Secondary | ICD-10-CM | POA: Diagnosis not present

## 2020-09-29 DIAGNOSIS — R945 Abnormal results of liver function studies: Secondary | ICD-10-CM | POA: Diagnosis not present

## 2020-09-29 DIAGNOSIS — G8929 Other chronic pain: Secondary | ICD-10-CM | POA: Diagnosis not present

## 2020-09-29 DIAGNOSIS — M797 Fibromyalgia: Secondary | ICD-10-CM | POA: Diagnosis not present

## 2020-09-29 DIAGNOSIS — Z1152 Encounter for screening for COVID-19: Secondary | ICD-10-CM | POA: Diagnosis not present

## 2020-09-29 DIAGNOSIS — F5081 Binge eating disorder: Secondary | ICD-10-CM | POA: Diagnosis not present

## 2020-09-29 DIAGNOSIS — M159 Polyosteoarthritis, unspecified: Secondary | ICD-10-CM | POA: Diagnosis not present

## 2020-09-29 DIAGNOSIS — F5101 Primary insomnia: Secondary | ICD-10-CM | POA: Diagnosis not present

## 2020-09-29 DIAGNOSIS — E1142 Type 2 diabetes mellitus with diabetic polyneuropathy: Secondary | ICD-10-CM | POA: Diagnosis not present

## 2020-09-29 DIAGNOSIS — F3341 Major depressive disorder, recurrent, in partial remission: Secondary | ICD-10-CM | POA: Diagnosis not present

## 2020-10-06 DIAGNOSIS — M50122 Cervical disc disorder at C5-C6 level with radiculopathy: Secondary | ICD-10-CM | POA: Diagnosis not present

## 2020-10-06 DIAGNOSIS — M5412 Radiculopathy, cervical region: Secondary | ICD-10-CM | POA: Diagnosis not present

## 2020-10-06 DIAGNOSIS — M797 Fibromyalgia: Secondary | ICD-10-CM | POA: Diagnosis not present

## 2020-10-06 DIAGNOSIS — E785 Hyperlipidemia, unspecified: Secondary | ICD-10-CM | POA: Diagnosis not present

## 2020-10-06 DIAGNOSIS — Z23 Encounter for immunization: Secondary | ICD-10-CM | POA: Diagnosis not present

## 2020-10-06 DIAGNOSIS — E039 Hypothyroidism, unspecified: Secondary | ICD-10-CM | POA: Diagnosis not present

## 2020-10-06 DIAGNOSIS — Z6841 Body Mass Index (BMI) 40.0 and over, adult: Secondary | ICD-10-CM | POA: Diagnosis not present

## 2020-10-06 DIAGNOSIS — E669 Obesity, unspecified: Secondary | ICD-10-CM | POA: Diagnosis not present

## 2020-10-06 DIAGNOSIS — Z79891 Long term (current) use of opiate analgesic: Secondary | ICD-10-CM | POA: Diagnosis not present

## 2020-10-06 DIAGNOSIS — M4802 Spinal stenosis, cervical region: Secondary | ICD-10-CM | POA: Diagnosis not present

## 2020-10-06 DIAGNOSIS — M50123 Cervical disc disorder at C6-C7 level with radiculopathy: Secondary | ICD-10-CM | POA: Diagnosis not present

## 2020-10-06 DIAGNOSIS — G629 Polyneuropathy, unspecified: Secondary | ICD-10-CM | POA: Diagnosis not present

## 2020-10-06 DIAGNOSIS — Z7901 Long term (current) use of anticoagulants: Secondary | ICD-10-CM | POA: Diagnosis not present

## 2020-10-06 DIAGNOSIS — Z9682 Presence of neurostimulator: Secondary | ICD-10-CM | POA: Diagnosis not present

## 2020-10-06 DIAGNOSIS — E119 Type 2 diabetes mellitus without complications: Secondary | ICD-10-CM | POA: Diagnosis not present

## 2020-10-06 DIAGNOSIS — F418 Other specified anxiety disorders: Secondary | ICD-10-CM | POA: Diagnosis not present

## 2020-10-06 DIAGNOSIS — I10 Essential (primary) hypertension: Secondary | ICD-10-CM | POA: Diagnosis not present

## 2020-10-06 DIAGNOSIS — Z79899 Other long term (current) drug therapy: Secondary | ICD-10-CM | POA: Diagnosis not present

## 2020-10-06 DIAGNOSIS — Z9851 Tubal ligation status: Secondary | ICD-10-CM | POA: Diagnosis not present

## 2020-10-06 DIAGNOSIS — M50121 Cervical disc disorder at C4-C5 level with radiculopathy: Secondary | ICD-10-CM | POA: Diagnosis not present

## 2020-10-07 DIAGNOSIS — E039 Hypothyroidism, unspecified: Secondary | ICD-10-CM | POA: Diagnosis not present

## 2020-10-07 DIAGNOSIS — E669 Obesity, unspecified: Secondary | ICD-10-CM | POA: Diagnosis not present

## 2020-10-07 DIAGNOSIS — E119 Type 2 diabetes mellitus without complications: Secondary | ICD-10-CM | POA: Diagnosis not present

## 2020-10-07 DIAGNOSIS — E785 Hyperlipidemia, unspecified: Secondary | ICD-10-CM | POA: Diagnosis not present

## 2020-10-07 DIAGNOSIS — M50121 Cervical disc disorder at C4-C5 level with radiculopathy: Secondary | ICD-10-CM | POA: Diagnosis not present

## 2020-10-07 DIAGNOSIS — I10 Essential (primary) hypertension: Secondary | ICD-10-CM | POA: Diagnosis not present

## 2020-10-27 DIAGNOSIS — F3341 Major depressive disorder, recurrent, in partial remission: Secondary | ICD-10-CM | POA: Diagnosis not present

## 2020-10-27 DIAGNOSIS — F5081 Binge eating disorder: Secondary | ICD-10-CM | POA: Diagnosis not present

## 2020-10-27 DIAGNOSIS — M797 Fibromyalgia: Secondary | ICD-10-CM | POA: Diagnosis not present

## 2020-10-27 DIAGNOSIS — E118 Type 2 diabetes mellitus with unspecified complications: Secondary | ICD-10-CM | POA: Diagnosis not present

## 2020-10-27 DIAGNOSIS — M159 Polyosteoarthritis, unspecified: Secondary | ICD-10-CM | POA: Diagnosis not present

## 2020-10-27 DIAGNOSIS — R911 Solitary pulmonary nodule: Secondary | ICD-10-CM | POA: Diagnosis not present

## 2020-10-27 DIAGNOSIS — R519 Headache, unspecified: Secondary | ICD-10-CM | POA: Diagnosis not present

## 2020-10-27 DIAGNOSIS — E7800 Pure hypercholesterolemia, unspecified: Secondary | ICD-10-CM | POA: Diagnosis not present

## 2020-10-27 DIAGNOSIS — F5101 Primary insomnia: Secondary | ICD-10-CM | POA: Diagnosis not present

## 2020-10-27 DIAGNOSIS — G8929 Other chronic pain: Secondary | ICD-10-CM | POA: Diagnosis not present

## 2020-10-27 DIAGNOSIS — E039 Hypothyroidism, unspecified: Secondary | ICD-10-CM | POA: Diagnosis not present

## 2020-10-27 DIAGNOSIS — I10 Essential (primary) hypertension: Secondary | ICD-10-CM | POA: Diagnosis not present

## 2020-10-27 DIAGNOSIS — R945 Abnormal results of liver function studies: Secondary | ICD-10-CM | POA: Diagnosis not present

## 2020-10-27 DIAGNOSIS — E1142 Type 2 diabetes mellitus with diabetic polyneuropathy: Secondary | ICD-10-CM | POA: Diagnosis not present

## 2020-10-27 DIAGNOSIS — R600 Localized edema: Secondary | ICD-10-CM | POA: Diagnosis not present

## 2020-11-09 DIAGNOSIS — E119 Type 2 diabetes mellitus without complications: Secondary | ICD-10-CM | POA: Diagnosis not present

## 2020-11-15 DIAGNOSIS — Z6841 Body Mass Index (BMI) 40.0 and over, adult: Secondary | ICD-10-CM | POA: Diagnosis not present

## 2020-11-15 DIAGNOSIS — J208 Acute bronchitis due to other specified organisms: Secondary | ICD-10-CM | POA: Diagnosis not present

## 2020-11-15 DIAGNOSIS — B9689 Other specified bacterial agents as the cause of diseases classified elsewhere: Secondary | ICD-10-CM | POA: Diagnosis not present

## 2020-11-15 DIAGNOSIS — I10 Essential (primary) hypertension: Secondary | ICD-10-CM | POA: Diagnosis not present

## 2020-11-15 DIAGNOSIS — E118 Type 2 diabetes mellitus with unspecified complications: Secondary | ICD-10-CM | POA: Diagnosis not present

## 2020-11-18 DIAGNOSIS — Z9689 Presence of other specified functional implants: Secondary | ICD-10-CM | POA: Diagnosis not present

## 2020-11-18 DIAGNOSIS — G894 Chronic pain syndrome: Secondary | ICD-10-CM | POA: Diagnosis not present

## 2020-11-18 DIAGNOSIS — M549 Dorsalgia, unspecified: Secondary | ICD-10-CM | POA: Diagnosis not present

## 2020-11-18 DIAGNOSIS — M533 Sacrococcygeal disorders, not elsewhere classified: Secondary | ICD-10-CM | POA: Diagnosis not present

## 2020-11-18 DIAGNOSIS — M5136 Other intervertebral disc degeneration, lumbar region: Secondary | ICD-10-CM | POA: Diagnosis not present

## 2020-11-18 DIAGNOSIS — Z79899 Other long term (current) drug therapy: Secondary | ICD-10-CM | POA: Diagnosis not present

## 2020-11-18 DIAGNOSIS — Z5181 Encounter for therapeutic drug level monitoring: Secondary | ICD-10-CM | POA: Diagnosis not present

## 2020-11-18 DIAGNOSIS — M7918 Myalgia, other site: Secondary | ICD-10-CM | POA: Diagnosis not present

## 2020-11-18 DIAGNOSIS — G8929 Other chronic pain: Secondary | ICD-10-CM | POA: Diagnosis not present

## 2020-11-18 DIAGNOSIS — M5441 Lumbago with sciatica, right side: Secondary | ICD-10-CM | POA: Diagnosis not present

## 2020-11-18 DIAGNOSIS — M5442 Lumbago with sciatica, left side: Secondary | ICD-10-CM | POA: Diagnosis not present

## 2020-11-18 DIAGNOSIS — M5440 Lumbago with sciatica, unspecified side: Secondary | ICD-10-CM | POA: Diagnosis not present

## 2020-11-18 DIAGNOSIS — G609 Hereditary and idiopathic neuropathy, unspecified: Secondary | ICD-10-CM | POA: Diagnosis not present

## 2020-11-18 DIAGNOSIS — M542 Cervicalgia: Secondary | ICD-10-CM | POA: Diagnosis not present

## 2020-11-24 DIAGNOSIS — R600 Localized edema: Secondary | ICD-10-CM | POA: Diagnosis not present

## 2020-11-24 DIAGNOSIS — F5101 Primary insomnia: Secondary | ICD-10-CM | POA: Diagnosis not present

## 2020-11-24 DIAGNOSIS — M797 Fibromyalgia: Secondary | ICD-10-CM | POA: Diagnosis not present

## 2020-11-24 DIAGNOSIS — E1142 Type 2 diabetes mellitus with diabetic polyneuropathy: Secondary | ICD-10-CM | POA: Diagnosis not present

## 2020-11-24 DIAGNOSIS — I10 Essential (primary) hypertension: Secondary | ICD-10-CM | POA: Diagnosis not present

## 2020-11-24 DIAGNOSIS — F5081 Binge eating disorder: Secondary | ICD-10-CM | POA: Diagnosis not present

## 2020-11-24 DIAGNOSIS — R519 Headache, unspecified: Secondary | ICD-10-CM | POA: Diagnosis not present

## 2020-11-24 DIAGNOSIS — G8929 Other chronic pain: Secondary | ICD-10-CM | POA: Diagnosis not present

## 2020-11-24 DIAGNOSIS — J208 Acute bronchitis due to other specified organisms: Secondary | ICD-10-CM | POA: Diagnosis not present

## 2020-11-24 DIAGNOSIS — R945 Abnormal results of liver function studies: Secondary | ICD-10-CM | POA: Diagnosis not present

## 2020-11-24 DIAGNOSIS — M159 Polyosteoarthritis, unspecified: Secondary | ICD-10-CM | POA: Diagnosis not present

## 2020-11-24 DIAGNOSIS — F3341 Major depressive disorder, recurrent, in partial remission: Secondary | ICD-10-CM | POA: Diagnosis not present

## 2020-11-30 DIAGNOSIS — Z981 Arthrodesis status: Secondary | ICD-10-CM | POA: Diagnosis not present

## 2020-11-30 DIAGNOSIS — M5412 Radiculopathy, cervical region: Secondary | ICD-10-CM | POA: Diagnosis not present

## 2020-12-27 DIAGNOSIS — R600 Localized edema: Secondary | ICD-10-CM | POA: Diagnosis not present

## 2020-12-27 DIAGNOSIS — M797 Fibromyalgia: Secondary | ICD-10-CM | POA: Diagnosis not present

## 2020-12-27 DIAGNOSIS — I10 Essential (primary) hypertension: Secondary | ICD-10-CM | POA: Diagnosis not present

## 2020-12-27 DIAGNOSIS — F5101 Primary insomnia: Secondary | ICD-10-CM | POA: Diagnosis not present

## 2020-12-27 DIAGNOSIS — G8929 Other chronic pain: Secondary | ICD-10-CM | POA: Diagnosis not present

## 2020-12-27 DIAGNOSIS — R945 Abnormal results of liver function studies: Secondary | ICD-10-CM | POA: Diagnosis not present

## 2020-12-27 DIAGNOSIS — E1142 Type 2 diabetes mellitus with diabetic polyneuropathy: Secondary | ICD-10-CM | POA: Diagnosis not present

## 2020-12-27 DIAGNOSIS — F3341 Major depressive disorder, recurrent, in partial remission: Secondary | ICD-10-CM | POA: Diagnosis not present

## 2020-12-27 DIAGNOSIS — Z6841 Body Mass Index (BMI) 40.0 and over, adult: Secondary | ICD-10-CM | POA: Diagnosis not present

## 2020-12-27 DIAGNOSIS — F5081 Binge eating disorder: Secondary | ICD-10-CM | POA: Diagnosis not present

## 2020-12-27 DIAGNOSIS — R519 Headache, unspecified: Secondary | ICD-10-CM | POA: Diagnosis not present

## 2021-01-20 DIAGNOSIS — M533 Sacrococcygeal disorders, not elsewhere classified: Secondary | ICD-10-CM | POA: Diagnosis not present

## 2021-01-20 DIAGNOSIS — M549 Dorsalgia, unspecified: Secondary | ICD-10-CM | POA: Diagnosis not present

## 2021-01-20 DIAGNOSIS — M542 Cervicalgia: Secondary | ICD-10-CM | POA: Diagnosis not present

## 2021-01-20 DIAGNOSIS — G894 Chronic pain syndrome: Secondary | ICD-10-CM | POA: Diagnosis not present

## 2021-01-20 DIAGNOSIS — G8929 Other chronic pain: Secondary | ICD-10-CM | POA: Diagnosis not present

## 2021-01-20 DIAGNOSIS — G609 Hereditary and idiopathic neuropathy, unspecified: Secondary | ICD-10-CM | POA: Diagnosis not present

## 2021-01-20 DIAGNOSIS — M5440 Lumbago with sciatica, unspecified side: Secondary | ICD-10-CM | POA: Diagnosis not present

## 2021-01-20 DIAGNOSIS — M7918 Myalgia, other site: Secondary | ICD-10-CM | POA: Diagnosis not present

## 2021-01-20 DIAGNOSIS — M5136 Other intervertebral disc degeneration, lumbar region: Secondary | ICD-10-CM | POA: Diagnosis not present

## 2021-01-20 DIAGNOSIS — Z9689 Presence of other specified functional implants: Secondary | ICD-10-CM | POA: Diagnosis not present

## 2021-01-21 DIAGNOSIS — F3341 Major depressive disorder, recurrent, in partial remission: Secondary | ICD-10-CM | POA: Diagnosis not present

## 2021-01-21 DIAGNOSIS — F5101 Primary insomnia: Secondary | ICD-10-CM | POA: Diagnosis not present

## 2021-01-21 DIAGNOSIS — R911 Solitary pulmonary nodule: Secondary | ICD-10-CM | POA: Diagnosis not present

## 2021-01-21 DIAGNOSIS — F5081 Binge eating disorder: Secondary | ICD-10-CM | POA: Diagnosis not present

## 2021-01-21 DIAGNOSIS — G8929 Other chronic pain: Secondary | ICD-10-CM | POA: Diagnosis not present

## 2021-01-21 DIAGNOSIS — R945 Abnormal results of liver function studies: Secondary | ICD-10-CM | POA: Diagnosis not present

## 2021-01-21 DIAGNOSIS — M159 Polyosteoarthritis, unspecified: Secondary | ICD-10-CM | POA: Diagnosis not present

## 2021-01-21 DIAGNOSIS — E1142 Type 2 diabetes mellitus with diabetic polyneuropathy: Secondary | ICD-10-CM | POA: Diagnosis not present

## 2021-01-21 DIAGNOSIS — E7800 Pure hypercholesterolemia, unspecified: Secondary | ICD-10-CM | POA: Diagnosis not present

## 2021-01-21 DIAGNOSIS — I10 Essential (primary) hypertension: Secondary | ICD-10-CM | POA: Diagnosis not present

## 2021-01-21 DIAGNOSIS — M797 Fibromyalgia: Secondary | ICD-10-CM | POA: Diagnosis not present

## 2021-01-21 DIAGNOSIS — R519 Headache, unspecified: Secondary | ICD-10-CM | POA: Diagnosis not present

## 2021-02-08 DIAGNOSIS — U071 COVID-19: Secondary | ICD-10-CM | POA: Diagnosis not present

## 2021-02-18 DIAGNOSIS — F5101 Primary insomnia: Secondary | ICD-10-CM | POA: Diagnosis not present

## 2021-02-18 DIAGNOSIS — F5081 Binge eating disorder: Secondary | ICD-10-CM | POA: Diagnosis not present

## 2021-02-18 DIAGNOSIS — M159 Polyosteoarthritis, unspecified: Secondary | ICD-10-CM | POA: Diagnosis not present

## 2021-02-18 DIAGNOSIS — R600 Localized edema: Secondary | ICD-10-CM | POA: Diagnosis not present

## 2021-02-18 DIAGNOSIS — R945 Abnormal results of liver function studies: Secondary | ICD-10-CM | POA: Diagnosis not present

## 2021-02-18 DIAGNOSIS — E1142 Type 2 diabetes mellitus with diabetic polyneuropathy: Secondary | ICD-10-CM | POA: Diagnosis not present

## 2021-02-18 DIAGNOSIS — M797 Fibromyalgia: Secondary | ICD-10-CM | POA: Diagnosis not present

## 2021-02-18 DIAGNOSIS — E118 Type 2 diabetes mellitus with unspecified complications: Secondary | ICD-10-CM | POA: Diagnosis not present

## 2021-02-18 DIAGNOSIS — I10 Essential (primary) hypertension: Secondary | ICD-10-CM | POA: Diagnosis not present

## 2021-02-18 DIAGNOSIS — R911 Solitary pulmonary nodule: Secondary | ICD-10-CM | POA: Diagnosis not present

## 2021-02-18 DIAGNOSIS — E7800 Pure hypercholesterolemia, unspecified: Secondary | ICD-10-CM | POA: Diagnosis not present

## 2021-02-18 DIAGNOSIS — G8929 Other chronic pain: Secondary | ICD-10-CM | POA: Diagnosis not present

## 2021-02-18 DIAGNOSIS — R519 Headache, unspecified: Secondary | ICD-10-CM | POA: Diagnosis not present

## 2021-03-04 DIAGNOSIS — F5081 Binge eating disorder: Secondary | ICD-10-CM | POA: Diagnosis not present

## 2021-03-04 DIAGNOSIS — G8929 Other chronic pain: Secondary | ICD-10-CM | POA: Diagnosis not present

## 2021-03-04 DIAGNOSIS — E1169 Type 2 diabetes mellitus with other specified complication: Secondary | ICD-10-CM | POA: Diagnosis not present

## 2021-03-04 DIAGNOSIS — R519 Headache, unspecified: Secondary | ICD-10-CM | POA: Diagnosis not present

## 2021-03-04 DIAGNOSIS — Z6841 Body Mass Index (BMI) 40.0 and over, adult: Secondary | ICD-10-CM | POA: Diagnosis not present

## 2021-03-04 DIAGNOSIS — F5101 Primary insomnia: Secondary | ICD-10-CM | POA: Diagnosis not present

## 2021-03-04 DIAGNOSIS — R600 Localized edema: Secondary | ICD-10-CM | POA: Diagnosis not present

## 2021-03-08 DIAGNOSIS — Z981 Arthrodesis status: Secondary | ICD-10-CM | POA: Diagnosis not present

## 2021-03-08 DIAGNOSIS — M5412 Radiculopathy, cervical region: Secondary | ICD-10-CM | POA: Diagnosis not present

## 2021-03-22 DIAGNOSIS — M25511 Pain in right shoulder: Secondary | ICD-10-CM | POA: Diagnosis not present

## 2021-03-22 DIAGNOSIS — R293 Abnormal posture: Secondary | ICD-10-CM | POA: Diagnosis not present

## 2021-03-22 DIAGNOSIS — M6281 Muscle weakness (generalized): Secondary | ICD-10-CM | POA: Diagnosis not present

## 2021-03-22 DIAGNOSIS — M25611 Stiffness of right shoulder, not elsewhere classified: Secondary | ICD-10-CM | POA: Diagnosis not present

## 2021-03-23 DIAGNOSIS — M549 Dorsalgia, unspecified: Secondary | ICD-10-CM | POA: Diagnosis not present

## 2021-03-23 DIAGNOSIS — M533 Sacrococcygeal disorders, not elsewhere classified: Secondary | ICD-10-CM | POA: Diagnosis not present

## 2021-03-23 DIAGNOSIS — M5442 Lumbago with sciatica, left side: Secondary | ICD-10-CM | POA: Diagnosis not present

## 2021-03-23 DIAGNOSIS — M7918 Myalgia, other site: Secondary | ICD-10-CM | POA: Diagnosis not present

## 2021-03-23 DIAGNOSIS — G609 Hereditary and idiopathic neuropathy, unspecified: Secondary | ICD-10-CM | POA: Diagnosis not present

## 2021-03-23 DIAGNOSIS — M5136 Other intervertebral disc degeneration, lumbar region: Secondary | ICD-10-CM | POA: Diagnosis not present

## 2021-03-23 DIAGNOSIS — M5440 Lumbago with sciatica, unspecified side: Secondary | ICD-10-CM | POA: Diagnosis not present

## 2021-03-23 DIAGNOSIS — G8929 Other chronic pain: Secondary | ICD-10-CM | POA: Diagnosis not present

## 2021-03-23 DIAGNOSIS — Z9689 Presence of other specified functional implants: Secondary | ICD-10-CM | POA: Diagnosis not present

## 2021-03-23 DIAGNOSIS — G894 Chronic pain syndrome: Secondary | ICD-10-CM | POA: Diagnosis not present

## 2021-03-23 DIAGNOSIS — M542 Cervicalgia: Secondary | ICD-10-CM | POA: Diagnosis not present

## 2021-03-23 DIAGNOSIS — M5441 Lumbago with sciatica, right side: Secondary | ICD-10-CM | POA: Diagnosis not present

## 2021-03-26 DIAGNOSIS — U071 COVID-19: Secondary | ICD-10-CM | POA: Diagnosis not present

## 2021-03-29 DIAGNOSIS — M25611 Stiffness of right shoulder, not elsewhere classified: Secondary | ICD-10-CM | POA: Diagnosis not present

## 2021-03-29 DIAGNOSIS — M6281 Muscle weakness (generalized): Secondary | ICD-10-CM | POA: Diagnosis not present

## 2021-03-29 DIAGNOSIS — M25511 Pain in right shoulder: Secondary | ICD-10-CM | POA: Diagnosis not present

## 2021-03-29 DIAGNOSIS — R293 Abnormal posture: Secondary | ICD-10-CM | POA: Diagnosis not present

## 2021-04-01 DIAGNOSIS — M797 Fibromyalgia: Secondary | ICD-10-CM | POA: Diagnosis not present

## 2021-04-01 DIAGNOSIS — R945 Abnormal results of liver function studies: Secondary | ICD-10-CM | POA: Diagnosis not present

## 2021-04-01 DIAGNOSIS — G8929 Other chronic pain: Secondary | ICD-10-CM | POA: Diagnosis not present

## 2021-04-01 DIAGNOSIS — E1169 Type 2 diabetes mellitus with other specified complication: Secondary | ICD-10-CM | POA: Diagnosis not present

## 2021-04-01 DIAGNOSIS — R519 Headache, unspecified: Secondary | ICD-10-CM | POA: Diagnosis not present

## 2021-04-01 DIAGNOSIS — E1142 Type 2 diabetes mellitus with diabetic polyneuropathy: Secondary | ICD-10-CM | POA: Diagnosis not present

## 2021-04-01 DIAGNOSIS — R600 Localized edema: Secondary | ICD-10-CM | POA: Diagnosis not present

## 2021-04-01 DIAGNOSIS — M159 Polyosteoarthritis, unspecified: Secondary | ICD-10-CM | POA: Diagnosis not present

## 2021-04-01 DIAGNOSIS — I10 Essential (primary) hypertension: Secondary | ICD-10-CM | POA: Diagnosis not present

## 2021-04-01 DIAGNOSIS — F5101 Primary insomnia: Secondary | ICD-10-CM | POA: Diagnosis not present

## 2021-04-01 DIAGNOSIS — F5081 Binge eating disorder: Secondary | ICD-10-CM | POA: Diagnosis not present

## 2021-04-05 DIAGNOSIS — R293 Abnormal posture: Secondary | ICD-10-CM | POA: Diagnosis not present

## 2021-04-05 DIAGNOSIS — M6281 Muscle weakness (generalized): Secondary | ICD-10-CM | POA: Diagnosis not present

## 2021-04-05 DIAGNOSIS — M25511 Pain in right shoulder: Secondary | ICD-10-CM | POA: Diagnosis not present

## 2021-04-05 DIAGNOSIS — M25611 Stiffness of right shoulder, not elsewhere classified: Secondary | ICD-10-CM | POA: Diagnosis not present

## 2021-04-13 DIAGNOSIS — M25611 Stiffness of right shoulder, not elsewhere classified: Secondary | ICD-10-CM | POA: Diagnosis not present

## 2021-04-13 DIAGNOSIS — R293 Abnormal posture: Secondary | ICD-10-CM | POA: Diagnosis not present

## 2021-04-13 DIAGNOSIS — M6281 Muscle weakness (generalized): Secondary | ICD-10-CM | POA: Diagnosis not present

## 2021-04-13 DIAGNOSIS — M25511 Pain in right shoulder: Secondary | ICD-10-CM | POA: Diagnosis not present

## 2021-04-22 DIAGNOSIS — Z23 Encounter for immunization: Secondary | ICD-10-CM | POA: Diagnosis not present

## 2021-04-29 DIAGNOSIS — F5101 Primary insomnia: Secondary | ICD-10-CM | POA: Diagnosis not present

## 2021-04-29 DIAGNOSIS — E1169 Type 2 diabetes mellitus with other specified complication: Secondary | ICD-10-CM | POA: Diagnosis not present

## 2021-04-29 DIAGNOSIS — E039 Hypothyroidism, unspecified: Secondary | ICD-10-CM | POA: Diagnosis not present

## 2021-04-29 DIAGNOSIS — R600 Localized edema: Secondary | ICD-10-CM | POA: Diagnosis not present

## 2021-04-29 DIAGNOSIS — G8929 Other chronic pain: Secondary | ICD-10-CM | POA: Diagnosis not present

## 2021-04-29 DIAGNOSIS — E1142 Type 2 diabetes mellitus with diabetic polyneuropathy: Secondary | ICD-10-CM | POA: Diagnosis not present

## 2021-04-29 DIAGNOSIS — I10 Essential (primary) hypertension: Secondary | ICD-10-CM | POA: Diagnosis not present

## 2021-04-29 DIAGNOSIS — R945 Abnormal results of liver function studies: Secondary | ICD-10-CM | POA: Diagnosis not present

## 2021-04-29 DIAGNOSIS — E7800 Pure hypercholesterolemia, unspecified: Secondary | ICD-10-CM | POA: Diagnosis not present

## 2021-04-29 DIAGNOSIS — F5081 Binge eating disorder: Secondary | ICD-10-CM | POA: Diagnosis not present

## 2021-04-29 DIAGNOSIS — M797 Fibromyalgia: Secondary | ICD-10-CM | POA: Diagnosis not present

## 2021-04-29 DIAGNOSIS — R519 Headache, unspecified: Secondary | ICD-10-CM | POA: Diagnosis not present

## 2021-04-29 DIAGNOSIS — M159 Polyosteoarthritis, unspecified: Secondary | ICD-10-CM | POA: Diagnosis not present

## 2021-05-01 DIAGNOSIS — U071 COVID-19: Secondary | ICD-10-CM | POA: Diagnosis not present

## 2021-05-18 DIAGNOSIS — G609 Hereditary and idiopathic neuropathy, unspecified: Secondary | ICD-10-CM | POA: Diagnosis not present

## 2021-05-18 DIAGNOSIS — M5136 Other intervertebral disc degeneration, lumbar region: Secondary | ICD-10-CM | POA: Diagnosis not present

## 2021-05-18 DIAGNOSIS — M5441 Lumbago with sciatica, right side: Secondary | ICD-10-CM | POA: Diagnosis not present

## 2021-05-18 DIAGNOSIS — Z9689 Presence of other specified functional implants: Secondary | ICD-10-CM | POA: Diagnosis not present

## 2021-05-18 DIAGNOSIS — M5442 Lumbago with sciatica, left side: Secondary | ICD-10-CM | POA: Diagnosis not present

## 2021-05-18 DIAGNOSIS — M549 Dorsalgia, unspecified: Secondary | ICD-10-CM | POA: Diagnosis not present

## 2021-05-18 DIAGNOSIS — M7918 Myalgia, other site: Secondary | ICD-10-CM | POA: Diagnosis not present

## 2021-05-18 DIAGNOSIS — M533 Sacrococcygeal disorders, not elsewhere classified: Secondary | ICD-10-CM | POA: Diagnosis not present

## 2021-05-18 DIAGNOSIS — M542 Cervicalgia: Secondary | ICD-10-CM | POA: Diagnosis not present

## 2021-05-18 DIAGNOSIS — G8929 Other chronic pain: Secondary | ICD-10-CM | POA: Diagnosis not present

## 2021-05-30 DIAGNOSIS — F419 Anxiety disorder, unspecified: Secondary | ICD-10-CM | POA: Diagnosis not present

## 2021-05-30 DIAGNOSIS — R519 Headache, unspecified: Secondary | ICD-10-CM | POA: Diagnosis not present

## 2021-05-30 DIAGNOSIS — R600 Localized edema: Secondary | ICD-10-CM | POA: Diagnosis not present

## 2021-05-30 DIAGNOSIS — R911 Solitary pulmonary nodule: Secondary | ICD-10-CM | POA: Diagnosis not present

## 2021-05-30 DIAGNOSIS — M159 Polyosteoarthritis, unspecified: Secondary | ICD-10-CM | POA: Diagnosis not present

## 2021-05-30 DIAGNOSIS — F5101 Primary insomnia: Secondary | ICD-10-CM | POA: Diagnosis not present

## 2021-05-30 DIAGNOSIS — M797 Fibromyalgia: Secondary | ICD-10-CM | POA: Diagnosis not present

## 2021-05-30 DIAGNOSIS — G8929 Other chronic pain: Secondary | ICD-10-CM | POA: Diagnosis not present

## 2021-05-30 DIAGNOSIS — F5081 Binge eating disorder: Secondary | ICD-10-CM | POA: Diagnosis not present

## 2021-05-30 DIAGNOSIS — R945 Abnormal results of liver function studies: Secondary | ICD-10-CM | POA: Diagnosis not present

## 2021-05-30 DIAGNOSIS — I10 Essential (primary) hypertension: Secondary | ICD-10-CM | POA: Diagnosis not present

## 2021-05-30 DIAGNOSIS — E1142 Type 2 diabetes mellitus with diabetic polyneuropathy: Secondary | ICD-10-CM | POA: Diagnosis not present

## 2021-07-07 DIAGNOSIS — G8929 Other chronic pain: Secondary | ICD-10-CM | POA: Diagnosis not present

## 2021-07-07 DIAGNOSIS — M159 Polyosteoarthritis, unspecified: Secondary | ICD-10-CM | POA: Diagnosis not present

## 2021-07-07 DIAGNOSIS — R945 Abnormal results of liver function studies: Secondary | ICD-10-CM | POA: Diagnosis not present

## 2021-07-07 DIAGNOSIS — F5081 Binge eating disorder: Secondary | ICD-10-CM | POA: Diagnosis not present

## 2021-07-07 DIAGNOSIS — M797 Fibromyalgia: Secondary | ICD-10-CM | POA: Diagnosis not present

## 2021-07-07 DIAGNOSIS — R519 Headache, unspecified: Secondary | ICD-10-CM | POA: Diagnosis not present

## 2021-07-07 DIAGNOSIS — F5101 Primary insomnia: Secondary | ICD-10-CM | POA: Diagnosis not present

## 2021-07-07 DIAGNOSIS — F419 Anxiety disorder, unspecified: Secondary | ICD-10-CM | POA: Diagnosis not present

## 2021-07-07 DIAGNOSIS — I10 Essential (primary) hypertension: Secondary | ICD-10-CM | POA: Diagnosis not present

## 2021-07-07 DIAGNOSIS — E7800 Pure hypercholesterolemia, unspecified: Secondary | ICD-10-CM | POA: Diagnosis not present

## 2021-07-07 DIAGNOSIS — E1169 Type 2 diabetes mellitus with other specified complication: Secondary | ICD-10-CM | POA: Diagnosis not present

## 2021-07-07 DIAGNOSIS — E1142 Type 2 diabetes mellitus with diabetic polyneuropathy: Secondary | ICD-10-CM | POA: Diagnosis not present

## 2021-07-07 DIAGNOSIS — Z6841 Body Mass Index (BMI) 40.0 and over, adult: Secondary | ICD-10-CM | POA: Diagnosis not present

## 2021-07-07 DIAGNOSIS — R600 Localized edema: Secondary | ICD-10-CM | POA: Diagnosis not present

## 2021-07-19 DIAGNOSIS — M549 Dorsalgia, unspecified: Secondary | ICD-10-CM | POA: Diagnosis not present

## 2021-07-19 DIAGNOSIS — Z5181 Encounter for therapeutic drug level monitoring: Secondary | ICD-10-CM | POA: Diagnosis not present

## 2021-07-19 DIAGNOSIS — Z9689 Presence of other specified functional implants: Secondary | ICD-10-CM | POA: Diagnosis not present

## 2021-07-19 DIAGNOSIS — M7918 Myalgia, other site: Secondary | ICD-10-CM | POA: Diagnosis not present

## 2021-07-19 DIAGNOSIS — M5136 Other intervertebral disc degeneration, lumbar region: Secondary | ICD-10-CM | POA: Diagnosis not present

## 2021-07-19 DIAGNOSIS — G609 Hereditary and idiopathic neuropathy, unspecified: Secondary | ICD-10-CM | POA: Diagnosis not present

## 2021-07-19 DIAGNOSIS — Z79899 Other long term (current) drug therapy: Secondary | ICD-10-CM | POA: Diagnosis not present

## 2021-07-19 DIAGNOSIS — M542 Cervicalgia: Secondary | ICD-10-CM | POA: Diagnosis not present

## 2021-07-19 DIAGNOSIS — G8929 Other chronic pain: Secondary | ICD-10-CM | POA: Diagnosis not present

## 2021-07-19 DIAGNOSIS — M533 Sacrococcygeal disorders, not elsewhere classified: Secondary | ICD-10-CM | POA: Diagnosis not present

## 2021-07-19 DIAGNOSIS — M5442 Lumbago with sciatica, left side: Secondary | ICD-10-CM | POA: Diagnosis not present

## 2021-07-19 DIAGNOSIS — M5441 Lumbago with sciatica, right side: Secondary | ICD-10-CM | POA: Diagnosis not present

## 2021-08-04 DIAGNOSIS — E1142 Type 2 diabetes mellitus with diabetic polyneuropathy: Secondary | ICD-10-CM | POA: Diagnosis not present

## 2021-08-04 DIAGNOSIS — R519 Headache, unspecified: Secondary | ICD-10-CM | POA: Diagnosis not present

## 2021-08-04 DIAGNOSIS — M159 Polyosteoarthritis, unspecified: Secondary | ICD-10-CM | POA: Diagnosis not present

## 2021-08-04 DIAGNOSIS — I10 Essential (primary) hypertension: Secondary | ICD-10-CM | POA: Diagnosis not present

## 2021-08-04 DIAGNOSIS — R600 Localized edema: Secondary | ICD-10-CM | POA: Diagnosis not present

## 2021-08-04 DIAGNOSIS — R945 Abnormal results of liver function studies: Secondary | ICD-10-CM | POA: Diagnosis not present

## 2021-08-04 DIAGNOSIS — F5081 Binge eating disorder: Secondary | ICD-10-CM | POA: Diagnosis not present

## 2021-08-04 DIAGNOSIS — M797 Fibromyalgia: Secondary | ICD-10-CM | POA: Diagnosis not present

## 2021-08-04 DIAGNOSIS — G8929 Other chronic pain: Secondary | ICD-10-CM | POA: Diagnosis not present

## 2021-08-04 DIAGNOSIS — F5101 Primary insomnia: Secondary | ICD-10-CM | POA: Diagnosis not present

## 2021-08-04 DIAGNOSIS — R911 Solitary pulmonary nodule: Secondary | ICD-10-CM | POA: Diagnosis not present

## 2021-08-04 DIAGNOSIS — F419 Anxiety disorder, unspecified: Secondary | ICD-10-CM | POA: Diagnosis not present

## 2021-08-17 DIAGNOSIS — Z6841 Body Mass Index (BMI) 40.0 and over, adult: Secondary | ICD-10-CM | POA: Diagnosis not present

## 2021-08-17 DIAGNOSIS — E785 Hyperlipidemia, unspecified: Secondary | ICD-10-CM | POA: Diagnosis not present

## 2021-08-17 DIAGNOSIS — Z9181 History of falling: Secondary | ICD-10-CM | POA: Diagnosis not present

## 2021-08-17 DIAGNOSIS — Z1331 Encounter for screening for depression: Secondary | ICD-10-CM | POA: Diagnosis not present

## 2021-08-17 DIAGNOSIS — Z0000 Encounter for general adult medical examination without abnormal findings: Secondary | ICD-10-CM | POA: Diagnosis not present

## 2021-08-17 DIAGNOSIS — E669 Obesity, unspecified: Secondary | ICD-10-CM | POA: Diagnosis not present

## 2021-09-05 DIAGNOSIS — I10 Essential (primary) hypertension: Secondary | ICD-10-CM | POA: Diagnosis not present

## 2021-09-05 DIAGNOSIS — M797 Fibromyalgia: Secondary | ICD-10-CM | POA: Diagnosis not present

## 2021-09-05 DIAGNOSIS — E1142 Type 2 diabetes mellitus with diabetic polyneuropathy: Secondary | ICD-10-CM | POA: Diagnosis not present

## 2021-09-05 DIAGNOSIS — R945 Abnormal results of liver function studies: Secondary | ICD-10-CM | POA: Diagnosis not present

## 2021-09-05 DIAGNOSIS — G8929 Other chronic pain: Secondary | ICD-10-CM | POA: Diagnosis not present

## 2021-09-05 DIAGNOSIS — F5081 Binge eating disorder: Secondary | ICD-10-CM | POA: Diagnosis not present

## 2021-09-05 DIAGNOSIS — M159 Polyosteoarthritis, unspecified: Secondary | ICD-10-CM | POA: Diagnosis not present

## 2021-09-05 DIAGNOSIS — R600 Localized edema: Secondary | ICD-10-CM | POA: Diagnosis not present

## 2021-09-05 DIAGNOSIS — F419 Anxiety disorder, unspecified: Secondary | ICD-10-CM | POA: Diagnosis not present

## 2021-09-05 DIAGNOSIS — R519 Headache, unspecified: Secondary | ICD-10-CM | POA: Diagnosis not present

## 2021-09-05 DIAGNOSIS — F5101 Primary insomnia: Secondary | ICD-10-CM | POA: Diagnosis not present

## 2021-09-05 DIAGNOSIS — R911 Solitary pulmonary nodule: Secondary | ICD-10-CM | POA: Diagnosis not present

## 2021-09-16 DIAGNOSIS — G609 Hereditary and idiopathic neuropathy, unspecified: Secondary | ICD-10-CM | POA: Diagnosis not present

## 2021-09-16 DIAGNOSIS — G8929 Other chronic pain: Secondary | ICD-10-CM | POA: Diagnosis not present

## 2021-09-16 DIAGNOSIS — M542 Cervicalgia: Secondary | ICD-10-CM | POA: Diagnosis not present

## 2021-09-16 DIAGNOSIS — M533 Sacrococcygeal disorders, not elsewhere classified: Secondary | ICD-10-CM | POA: Diagnosis not present

## 2021-09-16 DIAGNOSIS — M7918 Myalgia, other site: Secondary | ICD-10-CM | POA: Diagnosis not present

## 2021-09-16 DIAGNOSIS — G894 Chronic pain syndrome: Secondary | ICD-10-CM | POA: Diagnosis not present

## 2021-09-16 DIAGNOSIS — M5440 Lumbago with sciatica, unspecified side: Secondary | ICD-10-CM | POA: Diagnosis not present

## 2021-09-16 DIAGNOSIS — Z9689 Presence of other specified functional implants: Secondary | ICD-10-CM | POA: Diagnosis not present

## 2021-09-16 DIAGNOSIS — M549 Dorsalgia, unspecified: Secondary | ICD-10-CM | POA: Diagnosis not present

## 2021-09-16 DIAGNOSIS — M5442 Lumbago with sciatica, left side: Secondary | ICD-10-CM | POA: Diagnosis not present

## 2021-09-16 DIAGNOSIS — M5441 Lumbago with sciatica, right side: Secondary | ICD-10-CM | POA: Diagnosis not present

## 2021-09-16 DIAGNOSIS — M5136 Other intervertebral disc degeneration, lumbar region: Secondary | ICD-10-CM | POA: Diagnosis not present

## 2021-09-23 DIAGNOSIS — M25511 Pain in right shoulder: Secondary | ICD-10-CM | POA: Diagnosis not present

## 2021-09-23 DIAGNOSIS — M5412 Radiculopathy, cervical region: Secondary | ICD-10-CM | POA: Diagnosis not present

## 2021-09-23 DIAGNOSIS — Z981 Arthrodesis status: Secondary | ICD-10-CM | POA: Diagnosis not present

## 2021-10-04 DIAGNOSIS — M659 Synovitis and tenosynovitis, unspecified: Secondary | ICD-10-CM | POA: Diagnosis not present

## 2021-10-04 DIAGNOSIS — R600 Localized edema: Secondary | ICD-10-CM | POA: Diagnosis not present

## 2021-10-04 DIAGNOSIS — G8929 Other chronic pain: Secondary | ICD-10-CM | POA: Diagnosis not present

## 2021-10-04 DIAGNOSIS — I10 Essential (primary) hypertension: Secondary | ICD-10-CM | POA: Diagnosis not present

## 2021-10-04 DIAGNOSIS — F5081 Binge eating disorder: Secondary | ICD-10-CM | POA: Diagnosis not present

## 2021-10-04 DIAGNOSIS — R519 Headache, unspecified: Secondary | ICD-10-CM | POA: Diagnosis not present

## 2021-10-04 DIAGNOSIS — E1142 Type 2 diabetes mellitus with diabetic polyneuropathy: Secondary | ICD-10-CM | POA: Diagnosis not present

## 2021-10-04 DIAGNOSIS — F419 Anxiety disorder, unspecified: Secondary | ICD-10-CM | POA: Diagnosis not present

## 2021-10-04 DIAGNOSIS — Z6841 Body Mass Index (BMI) 40.0 and over, adult: Secondary | ICD-10-CM | POA: Diagnosis not present

## 2021-10-04 DIAGNOSIS — M797 Fibromyalgia: Secondary | ICD-10-CM | POA: Diagnosis not present

## 2021-10-18 DIAGNOSIS — Z6841 Body Mass Index (BMI) 40.0 and over, adult: Secondary | ICD-10-CM | POA: Diagnosis not present

## 2021-10-18 DIAGNOSIS — I10 Essential (primary) hypertension: Secondary | ICD-10-CM | POA: Diagnosis not present

## 2021-10-18 DIAGNOSIS — J039 Acute tonsillitis, unspecified: Secondary | ICD-10-CM | POA: Diagnosis not present

## 2021-10-18 DIAGNOSIS — E1169 Type 2 diabetes mellitus with other specified complication: Secondary | ICD-10-CM | POA: Diagnosis not present

## 2021-10-25 DIAGNOSIS — I2694 Multiple subsegmental pulmonary emboli without acute cor pulmonale: Secondary | ICD-10-CM | POA: Diagnosis not present

## 2021-10-25 DIAGNOSIS — N179 Acute kidney failure, unspecified: Secondary | ICD-10-CM | POA: Diagnosis not present

## 2021-10-25 DIAGNOSIS — E860 Dehydration: Secondary | ICD-10-CM | POA: Diagnosis not present

## 2021-10-25 DIAGNOSIS — I951 Orthostatic hypotension: Secondary | ICD-10-CM | POA: Diagnosis not present

## 2021-10-25 DIAGNOSIS — R079 Chest pain, unspecified: Secondary | ICD-10-CM | POA: Diagnosis not present

## 2021-10-26 DIAGNOSIS — F32A Depression, unspecified: Secondary | ICD-10-CM | POA: Diagnosis not present

## 2021-10-26 DIAGNOSIS — Z87891 Personal history of nicotine dependence: Secondary | ICD-10-CM | POA: Diagnosis not present

## 2021-10-26 DIAGNOSIS — E860 Dehydration: Secondary | ICD-10-CM | POA: Diagnosis not present

## 2021-10-26 DIAGNOSIS — I2699 Other pulmonary embolism without acute cor pulmonale: Secondary | ICD-10-CM | POA: Diagnosis not present

## 2021-10-26 DIAGNOSIS — Z7901 Long term (current) use of anticoagulants: Secondary | ICD-10-CM | POA: Diagnosis not present

## 2021-10-26 DIAGNOSIS — M549 Dorsalgia, unspecified: Secondary | ICD-10-CM | POA: Diagnosis not present

## 2021-10-26 DIAGNOSIS — F419 Anxiety disorder, unspecified: Secondary | ICD-10-CM | POA: Diagnosis not present

## 2021-10-26 DIAGNOSIS — I10 Essential (primary) hypertension: Secondary | ICD-10-CM | POA: Diagnosis not present

## 2021-10-26 DIAGNOSIS — G8929 Other chronic pain: Secondary | ICD-10-CM | POA: Diagnosis not present

## 2021-10-26 DIAGNOSIS — Z794 Long term (current) use of insulin: Secondary | ICD-10-CM | POA: Diagnosis not present

## 2021-10-26 DIAGNOSIS — R55 Syncope and collapse: Secondary | ICD-10-CM | POA: Diagnosis not present

## 2021-10-26 DIAGNOSIS — N179 Acute kidney failure, unspecified: Secondary | ICD-10-CM | POA: Diagnosis not present

## 2021-10-26 DIAGNOSIS — I951 Orthostatic hypotension: Secondary | ICD-10-CM | POA: Diagnosis not present

## 2021-10-26 DIAGNOSIS — Z7984 Long term (current) use of oral hypoglycemic drugs: Secondary | ICD-10-CM | POA: Diagnosis not present

## 2021-10-26 DIAGNOSIS — I361 Nonrheumatic tricuspid (valve) insufficiency: Secondary | ICD-10-CM | POA: Diagnosis not present

## 2021-10-26 DIAGNOSIS — R079 Chest pain, unspecified: Secondary | ICD-10-CM | POA: Diagnosis not present

## 2021-10-26 DIAGNOSIS — R531 Weakness: Secondary | ICD-10-CM | POA: Diagnosis not present

## 2021-10-26 DIAGNOSIS — E039 Hypothyroidism, unspecified: Secondary | ICD-10-CM | POA: Diagnosis not present

## 2021-10-26 DIAGNOSIS — E119 Type 2 diabetes mellitus without complications: Secondary | ICD-10-CM | POA: Diagnosis not present

## 2021-10-26 DIAGNOSIS — M542 Cervicalgia: Secondary | ICD-10-CM | POA: Diagnosis not present

## 2021-10-26 DIAGNOSIS — Z6841 Body Mass Index (BMI) 40.0 and over, adult: Secondary | ICD-10-CM | POA: Diagnosis not present

## 2021-10-26 DIAGNOSIS — I2694 Multiple subsegmental pulmonary emboli without acute cor pulmonale: Secondary | ICD-10-CM | POA: Diagnosis not present

## 2021-10-26 DIAGNOSIS — Z79899 Other long term (current) drug therapy: Secondary | ICD-10-CM | POA: Diagnosis not present

## 2021-10-26 DIAGNOSIS — M1990 Unspecified osteoarthritis, unspecified site: Secondary | ICD-10-CM | POA: Diagnosis not present

## 2021-11-01 DIAGNOSIS — R600 Localized edema: Secondary | ICD-10-CM | POA: Diagnosis not present

## 2021-11-01 DIAGNOSIS — E1142 Type 2 diabetes mellitus with diabetic polyneuropathy: Secondary | ICD-10-CM | POA: Diagnosis not present

## 2021-11-01 DIAGNOSIS — E039 Hypothyroidism, unspecified: Secondary | ICD-10-CM | POA: Diagnosis not present

## 2021-11-01 DIAGNOSIS — E1169 Type 2 diabetes mellitus with other specified complication: Secondary | ICD-10-CM | POA: Diagnosis not present

## 2021-11-01 DIAGNOSIS — I10 Essential (primary) hypertension: Secondary | ICD-10-CM | POA: Diagnosis not present

## 2021-11-01 DIAGNOSIS — F5081 Binge eating disorder: Secondary | ICD-10-CM | POA: Diagnosis not present

## 2021-11-01 DIAGNOSIS — I2699 Other pulmonary embolism without acute cor pulmonale: Secondary | ICD-10-CM | POA: Diagnosis not present

## 2021-11-01 DIAGNOSIS — R519 Headache, unspecified: Secondary | ICD-10-CM | POA: Diagnosis not present

## 2021-11-01 DIAGNOSIS — M797 Fibromyalgia: Secondary | ICD-10-CM | POA: Diagnosis not present

## 2021-11-01 DIAGNOSIS — G8929 Other chronic pain: Secondary | ICD-10-CM | POA: Diagnosis not present

## 2021-11-01 DIAGNOSIS — F419 Anxiety disorder, unspecified: Secondary | ICD-10-CM | POA: Diagnosis not present

## 2021-11-01 DIAGNOSIS — R945 Abnormal results of liver function studies: Secondary | ICD-10-CM | POA: Diagnosis not present

## 2021-11-01 DIAGNOSIS — N179 Acute kidney failure, unspecified: Secondary | ICD-10-CM | POA: Diagnosis not present

## 2021-11-01 DIAGNOSIS — F5101 Primary insomnia: Secondary | ICD-10-CM | POA: Diagnosis not present

## 2021-11-16 DIAGNOSIS — G8929 Other chronic pain: Secondary | ICD-10-CM | POA: Diagnosis not present

## 2021-11-16 DIAGNOSIS — M5136 Other intervertebral disc degeneration, lumbar region: Secondary | ICD-10-CM | POA: Diagnosis not present

## 2021-11-16 DIAGNOSIS — M5440 Lumbago with sciatica, unspecified side: Secondary | ICD-10-CM | POA: Diagnosis not present

## 2021-11-16 DIAGNOSIS — M542 Cervicalgia: Secondary | ICD-10-CM | POA: Diagnosis not present

## 2021-11-16 DIAGNOSIS — G609 Hereditary and idiopathic neuropathy, unspecified: Secondary | ICD-10-CM | POA: Diagnosis not present

## 2021-11-29 DIAGNOSIS — G8929 Other chronic pain: Secondary | ICD-10-CM | POA: Diagnosis not present

## 2021-11-29 DIAGNOSIS — I10 Essential (primary) hypertension: Secondary | ICD-10-CM | POA: Diagnosis not present

## 2021-11-29 DIAGNOSIS — I2699 Other pulmonary embolism without acute cor pulmonale: Secondary | ICD-10-CM | POA: Diagnosis not present

## 2021-11-29 DIAGNOSIS — R42 Dizziness and giddiness: Secondary | ICD-10-CM | POA: Diagnosis not present

## 2021-11-29 DIAGNOSIS — R600 Localized edema: Secondary | ICD-10-CM | POA: Diagnosis not present

## 2021-11-29 DIAGNOSIS — M797 Fibromyalgia: Secondary | ICD-10-CM | POA: Diagnosis not present

## 2021-11-29 DIAGNOSIS — F5081 Binge eating disorder: Secondary | ICD-10-CM | POA: Diagnosis not present

## 2021-11-29 DIAGNOSIS — R945 Abnormal results of liver function studies: Secondary | ICD-10-CM | POA: Diagnosis not present

## 2021-11-29 DIAGNOSIS — F419 Anxiety disorder, unspecified: Secondary | ICD-10-CM | POA: Diagnosis not present

## 2021-11-29 DIAGNOSIS — F5101 Primary insomnia: Secondary | ICD-10-CM | POA: Diagnosis not present

## 2021-11-29 DIAGNOSIS — E1142 Type 2 diabetes mellitus with diabetic polyneuropathy: Secondary | ICD-10-CM | POA: Diagnosis not present

## 2021-11-29 DIAGNOSIS — R519 Headache, unspecified: Secondary | ICD-10-CM | POA: Diagnosis not present

## 2021-12-27 DIAGNOSIS — R519 Headache, unspecified: Secondary | ICD-10-CM | POA: Diagnosis not present

## 2021-12-27 DIAGNOSIS — F419 Anxiety disorder, unspecified: Secondary | ICD-10-CM | POA: Diagnosis not present

## 2021-12-27 DIAGNOSIS — M797 Fibromyalgia: Secondary | ICD-10-CM | POA: Diagnosis not present

## 2021-12-27 DIAGNOSIS — F5081 Binge eating disorder: Secondary | ICD-10-CM | POA: Diagnosis not present

## 2021-12-27 DIAGNOSIS — I2699 Other pulmonary embolism without acute cor pulmonale: Secondary | ICD-10-CM | POA: Diagnosis not present

## 2021-12-27 DIAGNOSIS — M5412 Radiculopathy, cervical region: Secondary | ICD-10-CM | POA: Diagnosis not present

## 2021-12-27 DIAGNOSIS — E1142 Type 2 diabetes mellitus with diabetic polyneuropathy: Secondary | ICD-10-CM | POA: Diagnosis not present

## 2021-12-27 DIAGNOSIS — G8929 Other chronic pain: Secondary | ICD-10-CM | POA: Diagnosis not present

## 2021-12-27 DIAGNOSIS — F5101 Primary insomnia: Secondary | ICD-10-CM | POA: Diagnosis not present

## 2021-12-27 DIAGNOSIS — M159 Polyosteoarthritis, unspecified: Secondary | ICD-10-CM | POA: Diagnosis not present

## 2021-12-27 DIAGNOSIS — I10 Essential (primary) hypertension: Secondary | ICD-10-CM | POA: Diagnosis not present

## 2021-12-27 DIAGNOSIS — Z981 Arthrodesis status: Secondary | ICD-10-CM | POA: Diagnosis not present

## 2021-12-27 DIAGNOSIS — M25511 Pain in right shoulder: Secondary | ICD-10-CM | POA: Diagnosis not present

## 2021-12-27 DIAGNOSIS — R945 Abnormal results of liver function studies: Secondary | ICD-10-CM | POA: Diagnosis not present

## 2021-12-27 DIAGNOSIS — R600 Localized edema: Secondary | ICD-10-CM | POA: Diagnosis not present

## 2022-01-02 DIAGNOSIS — M5412 Radiculopathy, cervical region: Secondary | ICD-10-CM | POA: Diagnosis not present

## 2022-01-02 DIAGNOSIS — M542 Cervicalgia: Secondary | ICD-10-CM | POA: Diagnosis not present

## 2022-01-02 DIAGNOSIS — Z981 Arthrodesis status: Secondary | ICD-10-CM | POA: Diagnosis not present

## 2022-01-16 DIAGNOSIS — Z5181 Encounter for therapeutic drug level monitoring: Secondary | ICD-10-CM | POA: Diagnosis not present

## 2022-01-16 DIAGNOSIS — Z79899 Other long term (current) drug therapy: Secondary | ICD-10-CM | POA: Diagnosis not present

## 2022-01-16 DIAGNOSIS — M549 Dorsalgia, unspecified: Secondary | ICD-10-CM | POA: Diagnosis not present

## 2022-01-16 DIAGNOSIS — M5442 Lumbago with sciatica, left side: Secondary | ICD-10-CM | POA: Diagnosis not present

## 2022-01-16 DIAGNOSIS — M542 Cervicalgia: Secondary | ICD-10-CM | POA: Diagnosis not present

## 2022-01-16 DIAGNOSIS — Z9689 Presence of other specified functional implants: Secondary | ICD-10-CM | POA: Diagnosis not present

## 2022-01-16 DIAGNOSIS — M7918 Myalgia, other site: Secondary | ICD-10-CM | POA: Diagnosis not present

## 2022-01-16 DIAGNOSIS — M5136 Other intervertebral disc degeneration, lumbar region: Secondary | ICD-10-CM | POA: Diagnosis not present

## 2022-01-16 DIAGNOSIS — M5441 Lumbago with sciatica, right side: Secondary | ICD-10-CM | POA: Diagnosis not present

## 2022-01-24 DIAGNOSIS — F419 Anxiety disorder, unspecified: Secondary | ICD-10-CM | POA: Diagnosis not present

## 2022-01-24 DIAGNOSIS — I2699 Other pulmonary embolism without acute cor pulmonale: Secondary | ICD-10-CM | POA: Diagnosis not present

## 2022-01-24 DIAGNOSIS — F5081 Binge eating disorder: Secondary | ICD-10-CM | POA: Diagnosis not present

## 2022-01-24 DIAGNOSIS — R600 Localized edema: Secondary | ICD-10-CM | POA: Diagnosis not present

## 2022-01-24 DIAGNOSIS — R945 Abnormal results of liver function studies: Secondary | ICD-10-CM | POA: Diagnosis not present

## 2022-01-24 DIAGNOSIS — Z3202 Encounter for pregnancy test, result negative: Secondary | ICD-10-CM | POA: Diagnosis not present

## 2022-01-24 DIAGNOSIS — I10 Essential (primary) hypertension: Secondary | ICD-10-CM | POA: Diagnosis not present

## 2022-01-24 DIAGNOSIS — E1142 Type 2 diabetes mellitus with diabetic polyneuropathy: Secondary | ICD-10-CM | POA: Diagnosis not present

## 2022-01-24 DIAGNOSIS — G8929 Other chronic pain: Secondary | ICD-10-CM | POA: Diagnosis not present

## 2022-01-24 DIAGNOSIS — E7800 Pure hypercholesterolemia, unspecified: Secondary | ICD-10-CM | POA: Diagnosis not present

## 2022-01-24 DIAGNOSIS — M797 Fibromyalgia: Secondary | ICD-10-CM | POA: Diagnosis not present

## 2022-01-24 DIAGNOSIS — R519 Headache, unspecified: Secondary | ICD-10-CM | POA: Diagnosis not present

## 2022-01-24 DIAGNOSIS — F5101 Primary insomnia: Secondary | ICD-10-CM | POA: Diagnosis not present

## 2022-02-23 DIAGNOSIS — Z86711 Personal history of pulmonary embolism: Secondary | ICD-10-CM | POA: Diagnosis not present

## 2022-02-23 DIAGNOSIS — E279 Disorder of adrenal gland, unspecified: Secondary | ICD-10-CM | POA: Diagnosis not present

## 2022-02-23 DIAGNOSIS — K219 Gastro-esophageal reflux disease without esophagitis: Secondary | ICD-10-CM | POA: Diagnosis not present

## 2022-02-23 DIAGNOSIS — R739 Hyperglycemia, unspecified: Secondary | ICD-10-CM | POA: Diagnosis not present

## 2022-02-23 DIAGNOSIS — I10 Essential (primary) hypertension: Secondary | ICD-10-CM | POA: Diagnosis not present

## 2022-02-23 DIAGNOSIS — Z87891 Personal history of nicotine dependence: Secondary | ICD-10-CM | POA: Diagnosis not present

## 2022-02-23 DIAGNOSIS — R531 Weakness: Secondary | ICD-10-CM | POA: Diagnosis not present

## 2022-02-23 DIAGNOSIS — Z79899 Other long term (current) drug therapy: Secondary | ICD-10-CM | POA: Diagnosis not present

## 2022-02-23 DIAGNOSIS — M1990 Unspecified osteoarthritis, unspecified site: Secondary | ICD-10-CM | POA: Diagnosis not present

## 2022-02-23 DIAGNOSIS — Z7901 Long term (current) use of anticoagulants: Secondary | ICD-10-CM | POA: Diagnosis not present

## 2022-02-23 DIAGNOSIS — Z794 Long term (current) use of insulin: Secondary | ICD-10-CM | POA: Diagnosis not present

## 2022-02-23 DIAGNOSIS — R000 Tachycardia, unspecified: Secondary | ICD-10-CM | POA: Diagnosis not present

## 2022-02-23 DIAGNOSIS — I471 Supraventricular tachycardia: Secondary | ICD-10-CM | POA: Diagnosis not present

## 2022-02-23 DIAGNOSIS — R42 Dizziness and giddiness: Secondary | ICD-10-CM | POA: Diagnosis not present

## 2022-02-23 DIAGNOSIS — D509 Iron deficiency anemia, unspecified: Secondary | ICD-10-CM | POA: Diagnosis not present

## 2022-02-23 DIAGNOSIS — R110 Nausea: Secondary | ICD-10-CM | POA: Diagnosis not present

## 2022-02-23 DIAGNOSIS — R945 Abnormal results of liver function studies: Secondary | ICD-10-CM | POA: Diagnosis not present

## 2022-02-23 DIAGNOSIS — D649 Anemia, unspecified: Secondary | ICD-10-CM | POA: Diagnosis not present

## 2022-02-23 DIAGNOSIS — E538 Deficiency of other specified B group vitamins: Secondary | ICD-10-CM | POA: Diagnosis not present

## 2022-02-23 DIAGNOSIS — F419 Anxiety disorder, unspecified: Secondary | ICD-10-CM | POA: Diagnosis not present

## 2022-02-23 DIAGNOSIS — Z6841 Body Mass Index (BMI) 40.0 and over, adult: Secondary | ICD-10-CM | POA: Diagnosis not present

## 2022-02-23 DIAGNOSIS — E7800 Pure hypercholesterolemia, unspecified: Secondary | ICD-10-CM | POA: Diagnosis not present

## 2022-02-23 DIAGNOSIS — I2699 Other pulmonary embolism without acute cor pulmonale: Secondary | ICD-10-CM | POA: Diagnosis not present

## 2022-02-23 DIAGNOSIS — E119 Type 2 diabetes mellitus without complications: Secondary | ICD-10-CM | POA: Diagnosis not present

## 2022-02-23 DIAGNOSIS — R197 Diarrhea, unspecified: Secondary | ICD-10-CM | POA: Diagnosis not present

## 2022-02-23 DIAGNOSIS — E278 Other specified disorders of adrenal gland: Secondary | ICD-10-CM | POA: Diagnosis not present

## 2022-02-23 DIAGNOSIS — Z20822 Contact with and (suspected) exposure to covid-19: Secondary | ICD-10-CM | POA: Diagnosis not present

## 2022-02-23 DIAGNOSIS — E039 Hypothyroidism, unspecified: Secondary | ICD-10-CM | POA: Diagnosis not present

## 2022-02-23 DIAGNOSIS — F5101 Primary insomnia: Secondary | ICD-10-CM | POA: Diagnosis not present

## 2022-02-23 DIAGNOSIS — R600 Localized edema: Secondary | ICD-10-CM | POA: Diagnosis not present

## 2022-02-23 DIAGNOSIS — Z888 Allergy status to other drugs, medicaments and biological substances status: Secondary | ICD-10-CM | POA: Diagnosis not present

## 2022-02-23 DIAGNOSIS — F5081 Binge eating disorder: Secondary | ICD-10-CM | POA: Diagnosis not present

## 2022-02-23 DIAGNOSIS — Z7984 Long term (current) use of oral hypoglycemic drugs: Secondary | ICD-10-CM | POA: Diagnosis not present

## 2022-02-24 DIAGNOSIS — R197 Diarrhea, unspecified: Secondary | ICD-10-CM | POA: Diagnosis not present

## 2022-02-24 DIAGNOSIS — R000 Tachycardia, unspecified: Secondary | ICD-10-CM | POA: Diagnosis not present

## 2022-03-02 DIAGNOSIS — M797 Fibromyalgia: Secondary | ICD-10-CM | POA: Diagnosis not present

## 2022-03-02 DIAGNOSIS — R600 Localized edema: Secondary | ICD-10-CM | POA: Diagnosis not present

## 2022-03-02 DIAGNOSIS — E039 Hypothyroidism, unspecified: Secondary | ICD-10-CM | POA: Diagnosis not present

## 2022-03-02 DIAGNOSIS — I10 Essential (primary) hypertension: Secondary | ICD-10-CM | POA: Diagnosis not present

## 2022-03-02 DIAGNOSIS — R5382 Chronic fatigue, unspecified: Secondary | ICD-10-CM | POA: Diagnosis not present

## 2022-03-02 DIAGNOSIS — I471 Supraventricular tachycardia: Secondary | ICD-10-CM | POA: Diagnosis not present

## 2022-03-02 DIAGNOSIS — M159 Polyosteoarthritis, unspecified: Secondary | ICD-10-CM | POA: Diagnosis not present

## 2022-03-02 DIAGNOSIS — F3341 Major depressive disorder, recurrent, in partial remission: Secondary | ICD-10-CM | POA: Diagnosis not present

## 2022-03-02 DIAGNOSIS — E7800 Pure hypercholesterolemia, unspecified: Secondary | ICD-10-CM | POA: Diagnosis not present

## 2022-03-02 DIAGNOSIS — E1169 Type 2 diabetes mellitus with other specified complication: Secondary | ICD-10-CM | POA: Diagnosis not present

## 2022-03-02 DIAGNOSIS — E1142 Type 2 diabetes mellitus with diabetic polyneuropathy: Secondary | ICD-10-CM | POA: Diagnosis not present

## 2022-03-06 DIAGNOSIS — E113393 Type 2 diabetes mellitus with moderate nonproliferative diabetic retinopathy without macular edema, bilateral: Secondary | ICD-10-CM | POA: Diagnosis not present

## 2022-03-06 DIAGNOSIS — Z794 Long term (current) use of insulin: Secondary | ICD-10-CM | POA: Diagnosis not present

## 2022-03-09 DIAGNOSIS — E7800 Pure hypercholesterolemia, unspecified: Secondary | ICD-10-CM | POA: Diagnosis not present

## 2022-03-09 DIAGNOSIS — I10 Essential (primary) hypertension: Secondary | ICD-10-CM | POA: Diagnosis not present

## 2022-03-09 DIAGNOSIS — F3341 Major depressive disorder, recurrent, in partial remission: Secondary | ICD-10-CM | POA: Diagnosis not present

## 2022-03-09 DIAGNOSIS — F5101 Primary insomnia: Secondary | ICD-10-CM | POA: Diagnosis not present

## 2022-03-09 DIAGNOSIS — M159 Polyosteoarthritis, unspecified: Secondary | ICD-10-CM | POA: Diagnosis not present

## 2022-03-09 DIAGNOSIS — E1169 Type 2 diabetes mellitus with other specified complication: Secondary | ICD-10-CM | POA: Diagnosis not present

## 2022-03-09 DIAGNOSIS — M797 Fibromyalgia: Secondary | ICD-10-CM | POA: Diagnosis not present

## 2022-03-09 DIAGNOSIS — E1142 Type 2 diabetes mellitus with diabetic polyneuropathy: Secondary | ICD-10-CM | POA: Diagnosis not present

## 2022-03-09 DIAGNOSIS — E039 Hypothyroidism, unspecified: Secondary | ICD-10-CM | POA: Diagnosis not present

## 2022-03-09 DIAGNOSIS — I471 Supraventricular tachycardia: Secondary | ICD-10-CM | POA: Diagnosis not present

## 2022-03-09 DIAGNOSIS — R600 Localized edema: Secondary | ICD-10-CM | POA: Diagnosis not present

## 2022-03-16 DIAGNOSIS — Z9689 Presence of other specified functional implants: Secondary | ICD-10-CM | POA: Diagnosis not present

## 2022-03-16 DIAGNOSIS — M533 Sacrococcygeal disorders, not elsewhere classified: Secondary | ICD-10-CM | POA: Diagnosis not present

## 2022-03-16 DIAGNOSIS — M5440 Lumbago with sciatica, unspecified side: Secondary | ICD-10-CM | POA: Diagnosis not present

## 2022-03-16 DIAGNOSIS — G8929 Other chronic pain: Secondary | ICD-10-CM | POA: Diagnosis not present

## 2022-03-16 DIAGNOSIS — M5136 Other intervertebral disc degeneration, lumbar region: Secondary | ICD-10-CM | POA: Diagnosis not present

## 2022-03-23 DIAGNOSIS — M159 Polyosteoarthritis, unspecified: Secondary | ICD-10-CM | POA: Diagnosis not present

## 2022-03-23 DIAGNOSIS — M797 Fibromyalgia: Secondary | ICD-10-CM | POA: Diagnosis not present

## 2022-03-23 DIAGNOSIS — E1169 Type 2 diabetes mellitus with other specified complication: Secondary | ICD-10-CM | POA: Diagnosis not present

## 2022-03-23 DIAGNOSIS — I471 Supraventricular tachycardia: Secondary | ICD-10-CM | POA: Diagnosis not present

## 2022-03-23 DIAGNOSIS — R600 Localized edema: Secondary | ICD-10-CM | POA: Diagnosis not present

## 2022-03-23 DIAGNOSIS — I10 Essential (primary) hypertension: Secondary | ICD-10-CM | POA: Diagnosis not present

## 2022-03-23 DIAGNOSIS — E039 Hypothyroidism, unspecified: Secondary | ICD-10-CM | POA: Diagnosis not present

## 2022-03-23 DIAGNOSIS — U071 COVID-19: Secondary | ICD-10-CM | POA: Diagnosis not present

## 2022-03-23 DIAGNOSIS — E7800 Pure hypercholesterolemia, unspecified: Secondary | ICD-10-CM | POA: Diagnosis not present

## 2022-03-23 DIAGNOSIS — F3341 Major depressive disorder, recurrent, in partial remission: Secondary | ICD-10-CM | POA: Diagnosis not present

## 2022-03-23 DIAGNOSIS — E1142 Type 2 diabetes mellitus with diabetic polyneuropathy: Secondary | ICD-10-CM | POA: Diagnosis not present

## 2022-04-01 DIAGNOSIS — F3341 Major depressive disorder, recurrent, in partial remission: Secondary | ICD-10-CM | POA: Diagnosis not present

## 2022-04-01 DIAGNOSIS — E039 Hypothyroidism, unspecified: Secondary | ICD-10-CM | POA: Diagnosis not present

## 2022-04-01 DIAGNOSIS — M159 Polyosteoarthritis, unspecified: Secondary | ICD-10-CM | POA: Diagnosis not present

## 2022-04-01 DIAGNOSIS — I10 Essential (primary) hypertension: Secondary | ICD-10-CM | POA: Diagnosis not present

## 2022-04-01 DIAGNOSIS — J069 Acute upper respiratory infection, unspecified: Secondary | ICD-10-CM | POA: Diagnosis not present

## 2022-04-01 DIAGNOSIS — R600 Localized edema: Secondary | ICD-10-CM | POA: Diagnosis not present

## 2022-04-01 DIAGNOSIS — E7800 Pure hypercholesterolemia, unspecified: Secondary | ICD-10-CM | POA: Diagnosis not present

## 2022-04-01 DIAGNOSIS — E1169 Type 2 diabetes mellitus with other specified complication: Secondary | ICD-10-CM | POA: Diagnosis not present

## 2022-04-01 DIAGNOSIS — E1142 Type 2 diabetes mellitus with diabetic polyneuropathy: Secondary | ICD-10-CM | POA: Diagnosis not present

## 2022-04-01 DIAGNOSIS — M797 Fibromyalgia: Secondary | ICD-10-CM | POA: Diagnosis not present

## 2022-04-01 DIAGNOSIS — I471 Supraventricular tachycardia: Secondary | ICD-10-CM | POA: Diagnosis not present

## 2022-04-05 DIAGNOSIS — M25511 Pain in right shoulder: Secondary | ICD-10-CM | POA: Diagnosis not present

## 2022-04-07 DIAGNOSIS — F3341 Major depressive disorder, recurrent, in partial remission: Secondary | ICD-10-CM | POA: Diagnosis not present

## 2022-04-07 DIAGNOSIS — M159 Polyosteoarthritis, unspecified: Secondary | ICD-10-CM | POA: Diagnosis not present

## 2022-04-07 DIAGNOSIS — I10 Essential (primary) hypertension: Secondary | ICD-10-CM | POA: Diagnosis not present

## 2022-04-07 DIAGNOSIS — E039 Hypothyroidism, unspecified: Secondary | ICD-10-CM | POA: Diagnosis not present

## 2022-04-07 DIAGNOSIS — E7800 Pure hypercholesterolemia, unspecified: Secondary | ICD-10-CM | POA: Diagnosis not present

## 2022-04-07 DIAGNOSIS — R600 Localized edema: Secondary | ICD-10-CM | POA: Diagnosis not present

## 2022-04-07 DIAGNOSIS — Z981 Arthrodesis status: Secondary | ICD-10-CM | POA: Diagnosis not present

## 2022-04-07 DIAGNOSIS — I471 Supraventricular tachycardia: Secondary | ICD-10-CM | POA: Diagnosis not present

## 2022-04-07 DIAGNOSIS — M797 Fibromyalgia: Secondary | ICD-10-CM | POA: Diagnosis not present

## 2022-04-07 DIAGNOSIS — F5101 Primary insomnia: Secondary | ICD-10-CM | POA: Diagnosis not present

## 2022-04-07 DIAGNOSIS — M25511 Pain in right shoulder: Secondary | ICD-10-CM | POA: Diagnosis not present

## 2022-04-07 DIAGNOSIS — E1169 Type 2 diabetes mellitus with other specified complication: Secondary | ICD-10-CM | POA: Diagnosis not present

## 2022-04-07 DIAGNOSIS — M5412 Radiculopathy, cervical region: Secondary | ICD-10-CM | POA: Diagnosis not present

## 2022-04-07 DIAGNOSIS — E1142 Type 2 diabetes mellitus with diabetic polyneuropathy: Secondary | ICD-10-CM | POA: Diagnosis not present

## 2022-04-21 DIAGNOSIS — I10 Essential (primary) hypertension: Secondary | ICD-10-CM | POA: Diagnosis not present

## 2022-04-21 DIAGNOSIS — E1169 Type 2 diabetes mellitus with other specified complication: Secondary | ICD-10-CM | POA: Diagnosis not present

## 2022-04-21 DIAGNOSIS — F3341 Major depressive disorder, recurrent, in partial remission: Secondary | ICD-10-CM | POA: Diagnosis not present

## 2022-04-21 DIAGNOSIS — M797 Fibromyalgia: Secondary | ICD-10-CM | POA: Diagnosis not present

## 2022-04-21 DIAGNOSIS — E7800 Pure hypercholesterolemia, unspecified: Secondary | ICD-10-CM | POA: Diagnosis not present

## 2022-04-21 DIAGNOSIS — Z23 Encounter for immunization: Secondary | ICD-10-CM | POA: Diagnosis not present

## 2022-04-21 DIAGNOSIS — J028 Acute pharyngitis due to other specified organisms: Secondary | ICD-10-CM | POA: Diagnosis not present

## 2022-04-21 DIAGNOSIS — R600 Localized edema: Secondary | ICD-10-CM | POA: Diagnosis not present

## 2022-04-21 DIAGNOSIS — E1142 Type 2 diabetes mellitus with diabetic polyneuropathy: Secondary | ICD-10-CM | POA: Diagnosis not present

## 2022-04-21 DIAGNOSIS — B9689 Other specified bacterial agents as the cause of diseases classified elsewhere: Secondary | ICD-10-CM | POA: Diagnosis not present

## 2022-04-21 DIAGNOSIS — M159 Polyosteoarthritis, unspecified: Secondary | ICD-10-CM | POA: Diagnosis not present

## 2022-04-21 DIAGNOSIS — E039 Hypothyroidism, unspecified: Secondary | ICD-10-CM | POA: Diagnosis not present

## 2022-05-11 DIAGNOSIS — M533 Sacrococcygeal disorders, not elsewhere classified: Secondary | ICD-10-CM | POA: Diagnosis not present

## 2022-05-11 DIAGNOSIS — G894 Chronic pain syndrome: Secondary | ICD-10-CM | POA: Diagnosis not present

## 2022-05-11 DIAGNOSIS — M5442 Lumbago with sciatica, left side: Secondary | ICD-10-CM | POA: Diagnosis not present

## 2022-05-11 DIAGNOSIS — M5136 Other intervertebral disc degeneration, lumbar region: Secondary | ICD-10-CM | POA: Diagnosis not present

## 2022-05-11 DIAGNOSIS — M5440 Lumbago with sciatica, unspecified side: Secondary | ICD-10-CM | POA: Diagnosis not present

## 2022-05-11 DIAGNOSIS — M5441 Lumbago with sciatica, right side: Secondary | ICD-10-CM | POA: Diagnosis not present

## 2022-05-19 DIAGNOSIS — F5101 Primary insomnia: Secondary | ICD-10-CM | POA: Diagnosis not present

## 2022-05-19 DIAGNOSIS — E1169 Type 2 diabetes mellitus with other specified complication: Secondary | ICD-10-CM | POA: Diagnosis not present

## 2022-05-19 DIAGNOSIS — E039 Hypothyroidism, unspecified: Secondary | ICD-10-CM | POA: Diagnosis not present

## 2022-05-19 DIAGNOSIS — I2699 Other pulmonary embolism without acute cor pulmonale: Secondary | ICD-10-CM | POA: Diagnosis not present

## 2022-05-19 DIAGNOSIS — I10 Essential (primary) hypertension: Secondary | ICD-10-CM | POA: Diagnosis not present

## 2022-05-19 DIAGNOSIS — M159 Polyosteoarthritis, unspecified: Secondary | ICD-10-CM | POA: Diagnosis not present

## 2022-05-19 DIAGNOSIS — E1142 Type 2 diabetes mellitus with diabetic polyneuropathy: Secondary | ICD-10-CM | POA: Diagnosis not present

## 2022-05-19 DIAGNOSIS — I471 Supraventricular tachycardia, unspecified: Secondary | ICD-10-CM | POA: Diagnosis not present

## 2022-05-19 DIAGNOSIS — E7800 Pure hypercholesterolemia, unspecified: Secondary | ICD-10-CM | POA: Diagnosis not present

## 2022-05-19 DIAGNOSIS — F3341 Major depressive disorder, recurrent, in partial remission: Secondary | ICD-10-CM | POA: Diagnosis not present

## 2022-05-19 DIAGNOSIS — M797 Fibromyalgia: Secondary | ICD-10-CM | POA: Diagnosis not present

## 2022-05-22 DIAGNOSIS — M47812 Spondylosis without myelopathy or radiculopathy, cervical region: Secondary | ICD-10-CM | POA: Diagnosis not present

## 2022-05-22 DIAGNOSIS — Z981 Arthrodesis status: Secondary | ICD-10-CM | POA: Diagnosis not present

## 2022-05-22 DIAGNOSIS — M4722 Other spondylosis with radiculopathy, cervical region: Secondary | ICD-10-CM | POA: Diagnosis not present

## 2022-06-16 DIAGNOSIS — M159 Polyosteoarthritis, unspecified: Secondary | ICD-10-CM | POA: Diagnosis not present

## 2022-06-16 DIAGNOSIS — E1169 Type 2 diabetes mellitus with other specified complication: Secondary | ICD-10-CM | POA: Diagnosis not present

## 2022-06-16 DIAGNOSIS — M797 Fibromyalgia: Secondary | ICD-10-CM | POA: Diagnosis not present

## 2022-06-16 DIAGNOSIS — E1142 Type 2 diabetes mellitus with diabetic polyneuropathy: Secondary | ICD-10-CM | POA: Diagnosis not present

## 2022-06-16 DIAGNOSIS — I2699 Other pulmonary embolism without acute cor pulmonale: Secondary | ICD-10-CM | POA: Diagnosis not present

## 2022-06-16 DIAGNOSIS — F5101 Primary insomnia: Secondary | ICD-10-CM | POA: Diagnosis not present

## 2022-06-16 DIAGNOSIS — F3341 Major depressive disorder, recurrent, in partial remission: Secondary | ICD-10-CM | POA: Diagnosis not present

## 2022-06-16 DIAGNOSIS — I471 Supraventricular tachycardia, unspecified: Secondary | ICD-10-CM | POA: Diagnosis not present

## 2022-06-16 DIAGNOSIS — I10 Essential (primary) hypertension: Secondary | ICD-10-CM | POA: Diagnosis not present

## 2022-06-16 DIAGNOSIS — N930 Postcoital and contact bleeding: Secondary | ICD-10-CM | POA: Diagnosis not present

## 2022-06-16 DIAGNOSIS — E039 Hypothyroidism, unspecified: Secondary | ICD-10-CM | POA: Diagnosis not present

## 2022-07-06 DIAGNOSIS — G8929 Other chronic pain: Secondary | ICD-10-CM | POA: Diagnosis not present

## 2022-07-06 DIAGNOSIS — M5136 Other intervertebral disc degeneration, lumbar region: Secondary | ICD-10-CM | POA: Diagnosis not present

## 2022-07-06 DIAGNOSIS — M533 Sacrococcygeal disorders, not elsewhere classified: Secondary | ICD-10-CM | POA: Diagnosis not present

## 2022-07-06 DIAGNOSIS — Z5181 Encounter for therapeutic drug level monitoring: Secondary | ICD-10-CM | POA: Diagnosis not present

## 2022-07-06 DIAGNOSIS — Z79899 Other long term (current) drug therapy: Secondary | ICD-10-CM | POA: Diagnosis not present

## 2022-07-06 DIAGNOSIS — M5440 Lumbago with sciatica, unspecified side: Secondary | ICD-10-CM | POA: Diagnosis not present

## 2022-07-14 DIAGNOSIS — I10 Essential (primary) hypertension: Secondary | ICD-10-CM | POA: Diagnosis not present

## 2022-07-14 DIAGNOSIS — N930 Postcoital and contact bleeding: Secondary | ICD-10-CM | POA: Diagnosis not present

## 2022-07-14 DIAGNOSIS — F3341 Major depressive disorder, recurrent, in partial remission: Secondary | ICD-10-CM | POA: Diagnosis not present

## 2022-07-14 DIAGNOSIS — I471 Supraventricular tachycardia, unspecified: Secondary | ICD-10-CM | POA: Diagnosis not present

## 2022-07-14 DIAGNOSIS — F5101 Primary insomnia: Secondary | ICD-10-CM | POA: Diagnosis not present

## 2022-07-14 DIAGNOSIS — E039 Hypothyroidism, unspecified: Secondary | ICD-10-CM | POA: Diagnosis not present

## 2022-07-14 DIAGNOSIS — M159 Polyosteoarthritis, unspecified: Secondary | ICD-10-CM | POA: Diagnosis not present

## 2022-07-14 DIAGNOSIS — I2699 Other pulmonary embolism without acute cor pulmonale: Secondary | ICD-10-CM | POA: Diagnosis not present

## 2022-07-14 DIAGNOSIS — E1142 Type 2 diabetes mellitus with diabetic polyneuropathy: Secondary | ICD-10-CM | POA: Diagnosis not present

## 2022-07-14 DIAGNOSIS — E1169 Type 2 diabetes mellitus with other specified complication: Secondary | ICD-10-CM | POA: Diagnosis not present

## 2022-07-14 DIAGNOSIS — M797 Fibromyalgia: Secondary | ICD-10-CM | POA: Diagnosis not present
